# Patient Record
Sex: Male | Born: 1960 | Race: White | Hispanic: No | Marital: Married | Smoking: Former smoker
Health system: Southern US, Community
[De-identification: ages and names within clinical notes are randomized; demographics above are authoritative.]

## PROBLEM LIST (undated history)

## (undated) DIAGNOSIS — R51 Headache: Secondary | ICD-10-CM

## (undated) DIAGNOSIS — H269 Unspecified cataract: Secondary | ICD-10-CM

## (undated) DIAGNOSIS — R220 Localized swelling, mass and lump, head: Secondary | ICD-10-CM

## (undated) DIAGNOSIS — M1990 Unspecified osteoarthritis, unspecified site: Secondary | ICD-10-CM

## (undated) DIAGNOSIS — F419 Anxiety disorder, unspecified: Secondary | ICD-10-CM

## (undated) DIAGNOSIS — E785 Hyperlipidemia, unspecified: Secondary | ICD-10-CM

## (undated) DIAGNOSIS — C801 Malignant (primary) neoplasm, unspecified: Secondary | ICD-10-CM

## (undated) DIAGNOSIS — G43109 Migraine with aura, not intractable, without status migrainosus: Secondary | ICD-10-CM

## (undated) DIAGNOSIS — Z9289 Personal history of other medical treatment: Secondary | ICD-10-CM

## (undated) DIAGNOSIS — K219 Gastro-esophageal reflux disease without esophagitis: Secondary | ICD-10-CM

## (undated) DIAGNOSIS — I10 Essential (primary) hypertension: Secondary | ICD-10-CM

## (undated) HISTORY — DX: Gastro-esophageal reflux disease without esophagitis: K21.9

## (undated) HISTORY — DX: Hyperlipidemia, unspecified: E78.5

## (undated) HISTORY — DX: Headache: R51

## (undated) HISTORY — DX: Unspecified cataract: H26.9

## (undated) HISTORY — DX: Migraine with aura, not intractable, without status migrainosus: G43.109

## (undated) HISTORY — DX: Personal history of other medical treatment: Z92.89

## (undated) HISTORY — DX: Localized swelling, mass and lump, head: R22.0

## (undated) HISTORY — PX: TOE SURGERY: SHX1073

## (undated) HISTORY — PX: COLONOSCOPY: SHX174

## (undated) HISTORY — DX: Anxiety disorder, unspecified: F41.9

## (undated) HISTORY — DX: Malignant (primary) neoplasm, unspecified: C80.1

## (undated) HISTORY — PX: CARPAL TUNNEL RELEASE: SHX101

## (undated) HISTORY — DX: Essential (primary) hypertension: I10

---
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
## 1981-12-04 HISTORY — PX: SKIN CANCER EXCISION: SHX779

## 1997-12-04 DIAGNOSIS — Z9289 Personal history of other medical treatment: Secondary | ICD-10-CM

## 1997-12-04 HISTORY — DX: Personal history of other medical treatment: Z92.89

## 1997-12-04 HISTORY — PX: KNEE ARTHROSCOPY: SUR90

## 1998-10-18 ENCOUNTER — Other Ambulatory Visit: Admission: RE | Admit: 1998-10-18 | Discharge: 1998-10-18 | Payer: Self-pay | Admitting: Gastroenterology

## 1998-10-18 HISTORY — PX: ESOPHAGOGASTRODUODENOSCOPY: SHX1529

## 1998-11-03 HISTORY — PX: ESOPHAGOGASTRODUODENOSCOPY: SHX1529

## 2002-11-03 HISTORY — PX: KNEE ARTHROSCOPY: SUR90

## 2004-12-22 ENCOUNTER — Ambulatory Visit: Payer: Self-pay | Admitting: Family Medicine

## 2005-05-23 ENCOUNTER — Ambulatory Visit: Payer: Self-pay | Admitting: Family Medicine

## 2005-05-25 ENCOUNTER — Ambulatory Visit: Payer: Self-pay | Admitting: Family Medicine

## 2005-06-30 ENCOUNTER — Ambulatory Visit: Payer: Self-pay | Admitting: Family Medicine

## 2005-07-06 ENCOUNTER — Ambulatory Visit: Payer: Self-pay | Admitting: Family Medicine

## 2005-10-11 ENCOUNTER — Ambulatory Visit: Payer: Self-pay | Admitting: Family Medicine

## 2006-03-08 ENCOUNTER — Emergency Department: Payer: Self-pay | Admitting: Unknown Physician Specialty

## 2006-05-28 ENCOUNTER — Ambulatory Visit: Payer: Self-pay | Admitting: Family Medicine

## 2006-05-30 ENCOUNTER — Ambulatory Visit: Payer: Self-pay | Admitting: Family Medicine

## 2006-11-01 ENCOUNTER — Ambulatory Visit: Payer: Self-pay | Admitting: Family Medicine

## 2006-11-21 ENCOUNTER — Ambulatory Visit: Payer: Self-pay | Admitting: Family Medicine

## 2006-12-25 ENCOUNTER — Ambulatory Visit: Payer: Self-pay | Admitting: Family Medicine

## 2007-02-04 ENCOUNTER — Ambulatory Visit: Payer: Self-pay | Admitting: Family Medicine

## 2007-05-24 DIAGNOSIS — K219 Gastro-esophageal reflux disease without esophagitis: Secondary | ICD-10-CM | POA: Insufficient documentation

## 2007-05-24 DIAGNOSIS — I10 Essential (primary) hypertension: Secondary | ICD-10-CM | POA: Insufficient documentation

## 2007-05-24 DIAGNOSIS — M129 Arthropathy, unspecified: Secondary | ICD-10-CM | POA: Insufficient documentation

## 2007-06-03 ENCOUNTER — Ambulatory Visit: Payer: Self-pay | Admitting: Family Medicine

## 2007-06-03 LAB — CONVERTED CEMR LAB
ALT: 36 units/L (ref 0–53)
AST: 27 units/L (ref 0–37)
Basophils Relative: 0.8 % (ref 0.0–1.0)
Bilirubin, Direct: 0.1 mg/dL (ref 0.0–0.3)
Calcium: 9.6 mg/dL (ref 8.4–10.5)
Chloride: 108 meq/L (ref 96–112)
Creatinine, Ser: 1.4 mg/dL (ref 0.4–1.5)
Eosinophils Absolute: 0.2 10*3/uL (ref 0.0–0.6)
Eosinophils Relative: 4.9 % (ref 0.0–5.0)
GFR calc non Af Amer: 57 mL/min
Glucose, Bld: 98 mg/dL (ref 70–99)
HCT: 40.3 % (ref 39.0–52.0)
HDL: 30.2 mg/dL — ABNORMAL LOW (ref >39.0)
MCV: 88.9 fL (ref 78.0–100.0)
Neutrophils Relative %: 55.2 % (ref 43.0–77.0)
Platelets: 255 10*3/uL (ref 150–400)
RBC: 4.54 M/uL (ref 4.22–5.81)
RDW: 12.8 % (ref 11.5–14.6)
Total Bilirubin: 0.6 mg/dL (ref 0.3–1.2)
Total CHOL/HDL Ratio: 10.3
Triglycerides: 266 mg/dL (ref 0–149)
VLDL: 53 mg/dL — ABNORMAL HIGH (ref 0–40)
WBC: 5.1 10*3/uL (ref 4.5–10.5)

## 2007-09-11 ENCOUNTER — Ambulatory Visit: Payer: Self-pay | Admitting: Family Medicine

## 2007-10-21 ENCOUNTER — Ambulatory Visit: Payer: Self-pay | Admitting: Family Medicine

## 2007-10-21 LAB — CONVERTED CEMR LAB
ALT: 24 units/L (ref 0–53)
AST: 24 units/L (ref 0–37)

## 2007-12-16 ENCOUNTER — Ambulatory Visit: Payer: Self-pay | Admitting: Family Medicine

## 2007-12-16 LAB — CONVERTED CEMR LAB
ALT: 25 units/L (ref 0–53)
HDL: 27.3 mg/dL — ABNORMAL LOW (ref >39.0)
Total CHOL/HDL Ratio: 9.4
Triglycerides: 258 mg/dL (ref 0–149)

## 2007-12-18 ENCOUNTER — Ambulatory Visit: Payer: Self-pay | Admitting: Family Medicine

## 2008-01-29 ENCOUNTER — Ambulatory Visit: Payer: Self-pay | Admitting: Family Medicine

## 2008-01-29 LAB — CONVERTED CEMR LAB: AST: 24 units/L (ref 0–37)

## 2008-02-17 ENCOUNTER — Telehealth: Payer: Self-pay | Admitting: Family Medicine

## 2008-03-23 ENCOUNTER — Ambulatory Visit: Payer: Self-pay | Admitting: Family Medicine

## 2008-03-23 ENCOUNTER — Telehealth: Payer: Self-pay | Admitting: Family Medicine

## 2008-03-23 LAB — CONVERTED CEMR LAB
Cholesterol: 213 mg/dL (ref 0–200)
HDL: 32.4 mg/dL — ABNORMAL LOW (ref >39.0)
Total CHOL/HDL Ratio: 6.6
VLDL: 90 mg/dL — ABNORMAL HIGH (ref 0–40)

## 2008-03-26 ENCOUNTER — Ambulatory Visit: Payer: Self-pay | Admitting: Family Medicine

## 2008-05-08 ENCOUNTER — Ambulatory Visit: Payer: Self-pay | Admitting: Family Medicine

## 2008-05-11 LAB — CONVERTED CEMR LAB
ALT: 31 units/L (ref 0–53)
AST: 26 units/L (ref 0–37)
Albumin: 3.8 g/dL (ref 3.5–5.2)
Alkaline Phosphatase: 62 units/L (ref 39–117)
Total Protein: 6.5 g/dL (ref 6.0–8.3)

## 2008-05-13 ENCOUNTER — Encounter: Payer: Self-pay | Admitting: Family Medicine

## 2008-05-15 ENCOUNTER — Encounter: Payer: Self-pay | Admitting: Family Medicine

## 2008-05-29 ENCOUNTER — Telehealth: Payer: Self-pay | Admitting: Family Medicine

## 2008-09-25 ENCOUNTER — Encounter (INDEPENDENT_AMBULATORY_CARE_PROVIDER_SITE_OTHER): Payer: Self-pay | Admitting: *Deleted

## 2009-01-05 ENCOUNTER — Telehealth: Payer: Self-pay | Admitting: Family Medicine

## 2009-05-10 ENCOUNTER — Ambulatory Visit: Payer: Self-pay | Admitting: Family Medicine

## 2009-05-10 LAB — CONVERTED CEMR LAB
Bilirubin, Direct: 0.1 mg/dL (ref 0.0–0.3)
Calcium: 9.7 mg/dL (ref 8.4–10.5)
Creatinine, Ser: 1.2 mg/dL (ref 0.4–1.5)
Eosinophils Relative: 3 % (ref 0.0–5.0)
GFR calc non Af Amer: 68.03 mL/min (ref >60)
HDL: 32.6 mg/dL — ABNORMAL LOW (ref >39.00)
MCV: 88.4 fL (ref 78.0–100.0)
Monocytes Absolute: 0.5 10*3/uL (ref 0.1–1.0)
Neutrophils Relative %: 58.1 % (ref 43.0–77.0)
PSA: 0.69 ng/mL (ref 0.10–4.00)
Platelets: 188 10*3/uL (ref 150.0–400.0)
TSH: 0.79 microintl units/mL (ref 0.35–5.50)
Total Bilirubin: 0.8 mg/dL (ref 0.3–1.2)
Total CHOL/HDL Ratio: 5
Total Protein: 7.1 g/dL (ref 6.0–8.3)
Triglycerides: 228 mg/dL — ABNORMAL HIGH (ref 0.0–149.0)
VLDL: 45.6 mg/dL — ABNORMAL HIGH (ref 0.0–40.0)
WBC: 5.4 10*3/uL (ref 4.5–10.5)

## 2009-05-13 ENCOUNTER — Ambulatory Visit: Payer: Self-pay | Admitting: Family Medicine

## 2009-06-08 ENCOUNTER — Telehealth: Payer: Self-pay | Admitting: Family Medicine

## 2009-07-14 ENCOUNTER — Ambulatory Visit: Payer: Self-pay | Admitting: Family Medicine

## 2009-07-14 LAB — CONVERTED CEMR LAB: OCCULT 1: POSITIVE

## 2009-07-15 ENCOUNTER — Ambulatory Visit: Payer: Self-pay | Admitting: Family Medicine

## 2009-08-06 ENCOUNTER — Ambulatory Visit: Payer: Self-pay | Admitting: Gastroenterology

## 2009-08-20 ENCOUNTER — Ambulatory Visit: Payer: Self-pay | Admitting: Gastroenterology

## 2009-08-20 LAB — HM COLONOSCOPY

## 2010-07-11 ENCOUNTER — Encounter (INDEPENDENT_AMBULATORY_CARE_PROVIDER_SITE_OTHER): Payer: Self-pay | Admitting: *Deleted

## 2010-07-26 ENCOUNTER — Telehealth: Payer: Self-pay | Admitting: Family Medicine

## 2010-09-05 ENCOUNTER — Ambulatory Visit: Payer: Self-pay | Admitting: Family Medicine

## 2010-09-05 LAB — CONVERTED CEMR LAB
AST: 32 units/L (ref 0–37)
Albumin: 4.1 g/dL (ref 3.5–5.2)
Alkaline Phosphatase: 80 units/L (ref 39–117)
Basophils Relative: 0.5 % (ref 0.0–3.0)
CO2: 27 meq/L (ref 19–32)
Calcium: 9.2 mg/dL (ref 8.4–10.5)
Eosinophils Relative: 4.7 % (ref 0.0–5.0)
GFR calc non Af Amer: 69.68 mL/min (ref >60)
HDL: 36.2 mg/dL — ABNORMAL LOW (ref >39.00)
Hemoglobin: 14.2 g/dL (ref 13.0–17.0)
Lymphocytes Relative: 24 % (ref 12.0–46.0)
Monocytes Relative: 8.7 % (ref 3.0–12.0)
Neutro Abs: 3.5 10*3/uL (ref 1.4–7.7)
RBC: 4.5 M/uL (ref 4.22–5.81)
Sodium: 137 meq/L (ref 135–145)
Total CHOL/HDL Ratio: 7
Total Protein: 6.6 g/dL (ref 6.0–8.3)
Triglycerides: 393 mg/dL — ABNORMAL HIGH (ref 0.0–149.0)
WBC: 5.7 10*3/uL (ref 4.5–10.5)

## 2010-09-07 ENCOUNTER — Ambulatory Visit: Payer: Self-pay | Admitting: Family Medicine

## 2011-01-03 NOTE — Progress Notes (Signed)
Summary: hyoscyamine  Phone Note Refill Request Message from:  Fax from Pharmacy on July 26, 2010 11:37 AM  Refills Requested: Medication #1:  HYOSCYAMINE SULFATE 0.125 MG SUBL Take one by mouth before meals and at bedtime prn Refill request from [facility] pharmacy. [phone].   Initial call taken by: Melody Comas,  July 26, 2010 11:38 AM    Prescriptions: HYOSCYAMINE SULFATE 0.125 MG SUBL (HYOSCYAMINE SULFATE) Take one by mouth before meals and at bedtime prn  #120 x 12   Entered and Authorized by:   Crawford Givens MD   Signed by:   Crawford Givens MD on 07/26/2010   Method used:   Electronically to        [facility]* (retail)       [address]       [place], [place]  [address]       Ph: [phone]       Fax: [phone]   RxID:   [id number]

## 2011-01-03 NOTE — Assessment & Plan Note (Signed)
Summary: cpx/alc   Vital Signs:  Patient profile:   50 year old male Height:      71 inches Weight:      195 pounds BMI:     27.30 Temp:     98.2 degrees F oral Pulse rate:   64 / minute Pulse rhythm:   regular BP sitting:   124 / 84  (left arm) Cuff size:   large  Vitals Entered By: Sydell Axon LPN (September 07, 2010 2:54 PM) CC: 30 Minute checkup, had a colonoscopy 09/10 by Dr. Arlyce Dice, requested flu vaccine   History of Present Illness: Pt here for Comp Exam. Feels well with no complaints. He had insect bite on the right elbow which made him sick. He was given shot of antibiotic and had swelling for a while....finally now getting back to normal.  Preventive Screening-Counseling & Management  Alcohol-Tobacco     Alcohol drinks/day: 0     Smoking Status: quit     Year Quit: 1996     Pack years: 25     Passive Smoke Exposure: no  Caffeine-Diet-Exercise     Caffeine use/day: 5+     Does Patient Exercise: yes     Type of exercise: ex bike     Times/week: 3  Problems Prior to Update: 1)  Guaiac Positive Stool Via Cards 2/6  (ICD-578.1) 2)  Special Screening Malig Neoplasms Other Sites  (ICD-V76.49) 3)  Health Maintenance Exam  (ICD-V70.0) 4)  Screening For Malignant Neoplasm, Prostate  (ICD-V76.44) 5)  Hypercholesterolemia/trig 272/672  (ICD-272.0) 6)  Arthritis  (ICD-716.90) 7)  Hx, Personal, Tobacco Use, Quit 02/01  (ICD-V15.82) 8)  Hypertension  (ICD-401.9) 9)  Headaches, Frequent (DR. MANNIX)  (ICD-784.0) 10)  Gerd  (ICD-530.81) 11)  Anxiety, (DR. Dorethea Clan)  (ICD-300.00)  Medications Prior to Update: 1)  Bisoprolol-Hydrochlorothiazide 5-6.25 Mg Tabs (Bisoprolol-Hydrochlorothiazide) .... Take One By Mouth Daily 2)  Hyoscyamine Sulfate 0.125 Mg Subl (Hyoscyamine Sulfate) .... Take One By Mouth Before Meals and At Bedtime Prn 3)  Prilosec Otc 20 Mg Tbec (Omeprazole Magnesium) .... Take 1 Tablet By Mouth Once A Day 4)  Calan Sr 240 Mg  Tbcr (Verapamil Hcl) .... Take One  By Mouth Twice A Day 5)  Paxil 20 Mg  Tabs (Paroxetine Hcl) .... Take Two By Mouth Daily 6)  Crestor 10 Mg  Tabs (Rosuvastatin Calcium) .... One Tab By Mouth At Night  Allergies: No Known Drug Allergies  Past History:  Past Medical History: Last updated: 05/24/2007 Anxiety GERD Headache Hypertension  Past Surgical History: Last updated: 08/22/2009 Tonsillectomy 1968 Skin Ca Excision 1983 Arthroscopy left knee 1999 EGD- Esoph stricture, esophagitis, duodenitis 10/18/98 EGD- Esoph dilation 12/99 ETT- wnl 1999 Arthroscopy right knee (Dr. Chaney Malling 12/03 Neck Excision (Dr Bertram Savin) 05/15/2008 Colonoscopy Mild divertics o/w Nml ([name]) 08/20/09      10 yrs.  Family History: Last updated: 09/07/2010 Father: dec 74 COPD   and has alcoholism  Lung Ca Mother: A 30  HTN Chol  depression/dementia Sister  A 57  mitral valve prolapse breast Ca (Mastectomy  2007)  Social History: Last updated: 09/11/2007 Marital Status: Married Children: 1 Occupation: Equities trader in [employer]  Risk Factors: Alcohol Use: 0 (09/07/2010) Caffeine Use: 5+ (09/07/2010) Exercise: yes (09/07/2010)  Risk Factors: Smoking Status: quit (09/07/2010) Passive Smoke Exposure: no (09/07/2010)  Family History: Father: dec 74 COPD   and has alcoholism  Lung Ca Mother: A 74  HTN Chol  depression/dementia Sister  A 81  mitral valve prolapse breast Ca (Mastectomy  2007)  Review of Systems General:  Denies chills, fatigue, fever, sweats, weakness, and weight loss. Eyes:  Denies blurring, eye pain, and itching. ENT:  Denies decreased hearing, earache, and ringing in ears. CV:  Denies chest pain or discomfort, fainting, fatigue, palpitations, shortness of breath with exertion, swelling of feet, and swelling of hands. Resp:  Denies cough, shortness of breath, and wheezing. GI:  Denies abdominal pain, bloody stools, change in bowel habits, constipation, dark tarry stools,  diarrhea, indigestion, loss of appetite, nausea, vomiting, vomiting blood, and yellowish skin color. GU:  Complains of nocturia; denies discharge, dysuria, and urinary frequency; stable. MS:  Complains of joint pain; denies low back pain, muscle aches, and cramps. Derm:  Denies dryness, itching, and rash. Neuro:  Denies numbness, poor balance, tingling, and tremors.  Physical Exam  General:  Well-developed,well-nourished,in no acute distress; alert,appropriate and cooperative throughout examination Head:  Normocephalic and atraumatic without obvious abnormalities. No apparent alopecia or balding. Eyes:  Conjunctiva clear bilaterally.  Ears:  External ear exam shows no significant lesions or deformities.  Otoscopic examination reveals clear canals, tympanic membranes are intact bilaterally without bulging, retraction, inflammation or discharge. Hearing is grossly normal bilaterally. Nose:  External nasal examination shows no deformity or inflammation. Nasal mucosa are pink and moist without lesions or exudates. Mouth:  Oral mucosa and oropharynx without lesions or exudates.  Teeth in good repair. Neck:  No deformities, masses, or tenderness noted. Chest Wall:  No deformities, masses, tenderness or gynecomastia noted. Breasts:  No masses or gynecomastia noted Lungs:  Normal respiratory effort, chest expands symmetrically. Lungs are clear to auscultation, no crackles or wheezes. Heart:  Normal rate and regular rhythm. S1 and S2 normal without gallop, murmur, click, rub or other extra sounds. Abdomen:  Bowel sounds positive,abdomen soft and non-tender without masses, organomegaly or hernias noted. Rectal:  No external abnormalities noted. Normal sphincter tone. No rectal masses or tenderness. G neg. Genitalia:  Testes bilaterally descended without nodularity, tenderness or masses. No scrotal masses or lesions. No penis lesions or urethral discharge. Prostate:  Prostate gland firm and smooth, no  enlargement, nodularity, tenderness, mass, asymmetry or induration. 20gm Msk:  No deformity or scoliosis noted of thoracic or lumbar spine.   Pulses:  R and L carotid,radial,femoral,dorsalis pedis and posterior tibial pulses are full and equal bilaterally Extremities:  No clubbing, cyanosis, edema, or deformity noted with normal full range of motion of all joints.   Neurologic:  No cranial nerve deficits noted. Station and gait are normal.  Sensory, motor and coordinative functions appear intact. Skin:  Intact without suspicious lesions or rashes, lipoma 3cm top of posterior neck just into scalp. Cervical Nodes:  No lymphadenopathy noted Inguinal Nodes:  No significant adenopathy Psych:  Cognition and judgment appear intact. Alert and cooperative with normal attention span and concentration. No apparent delusions, illusions, hallucinations   Impression & Recommendations:  Problem # 1:  HEALTH MAINTENANCE EXAM (ICD-V70.0)  Problem # 2:  GUAIAC POSITIVE STOOL VIA CARDS 2/6 (ICD-578.1) Colonoscopy in Oct nml.  Problem # 3:  SCREENING FOR MALIGNANT NEOPLASM, PROSTATE (ICD-V76.44) Assessment: Unchanged Stable.  Problem # 4:  HYPERCHOLESTEROLEMIA/TRIG 272/672 (ICD-272.0) Assessment: Deteriorated  Off Crestor for 2 months due to being told at [facility] that liver and kidney fctn off. Nos today fine...back on Crestor. His updated medication list for this problem includes:    Crestor 10 Mg Tabs (Rosuvastatin calcium) ..... One tab by mouth at night  Labs Reviewed: SGOT:  32 (09/05/2010)   SGPT: 42 (09/05/2010)   HDL:36.20 (09/05/2010), 32.60 (05/10/2009)  LDL:DEL (03/23/2008), DEL (12/16/2007)  Chol:247 (09/05/2010), 167 (05/10/2009)  Trig:393.0 (09/05/2010), 228.0 (05/10/2009)  Problem # 5:  HYPERTENSION (ICD-401.9) Assessment: Unchanged Great control, cont curr meds. His updated medication list for this problem includes:    Bisoprolol-hydrochlorothiazide 5-6.25 Mg Tabs  (Bisoprolol-hydrochlorothiazide) .Marland Kitchen... Take one by mouth daily    Calan Sr 240 Mg Tbcr (Verapamil hcl) .Marland Kitchen... Take one by mouth twice a day  BP today: 124/84 Prior BP: 120/80 (07/15/2009)  Labs Reviewed: K+: 3.9 (09/05/2010) Creat: : 1.2 (09/05/2010)   Chol: 247 (09/05/2010)   HDL: 36.20 (09/05/2010)   LDL: DEL (03/23/2008)   TG: 393.0 (09/05/2010)  Problem # 6:  HEADACHES, FREQUENT (DR. MANNIX) (ICD-784.0) Assessment: Unchanged Well controlled. His updated medication list for this problem includes:    Bisoprolol-hydrochlorothiazide 5-6.25 Mg Tabs (Bisoprolol-hydrochlorothiazide) .Marland Kitchen... Take one by mouth daily  Problem # 7:  ANXIETY, (DR. Dorethea Clan) (ICD-300.00) Assessment: Unchanged  Well controlled, cont curr meds. His updated medication list for this problem includes:    Paxil 20 Mg Tabs (Paroxetine hcl) .Marland Kitchen... Take two by mouth daily  Discussed medication use and relaxation techniques.   Problem # 8:  GERD (ICD-530.81) Assessment: Unchanged Stable. His updated medication list for this problem includes:    Hyoscyamine Sulfate 0.125 Mg Subl (Hyoscyamine sulfate) .Marland Kitchen... Take one by mouth before meals and at bedtime prn    Prilosec Otc 20 Mg Tbec (Omeprazole magnesium) .Marland Kitchen... Take 1 tablet by mouth once a day  Diagnostics Reviewed:  Discussed lifestyle modifications, diet, antacids/medications, and preventive measures. Handout provided.   Complete Medication List: 1)  Bisoprolol-hydrochlorothiazide 5-6.25 Mg Tabs (Bisoprolol-hydrochlorothiazide) .... Take one by mouth daily 2)  Hyoscyamine Sulfate 0.125 Mg Subl (Hyoscyamine sulfate) .... Take one by mouth before meals and at bedtime prn 3)  Prilosec Otc 20 Mg Tbec (Omeprazole magnesium) .... Take 1 tablet by mouth once a day 4)  Calan Sr 240 Mg Tbcr (Verapamil hcl) .... Take one by mouth twice a day 5)  Paxil 20 Mg Tabs (Paroxetine hcl) .... Take two by mouth daily 6)  Crestor 10 Mg Tabs (Rosuvastatin calcium) .... One tab by mouth  at night  Other Orders: Admin 1st Vaccine ([address]) Flu Vaccine 76yrs + ([address])  Patient Instructions: 1)  RTC one year, sooner as needed.  2)  Back on Crestor, watch sweets and carb intake.  Current Allergies (reviewed today): No known allergies   Flu Vaccine Consent Questions     Do you have a history of severe allergic reactions to this vaccine? no    Any prior history of allergic reactions to egg and/or gelatin? no    Do you have a sensitivity to the preservative Thimersol? no    Do you have a past history of Guillan-Barre Syndrome? no    Do you currently have an acute febrile illness? no    Have you ever had a severe reaction to latex? no    Vaccine information given and explained to patient? yes    Are you currently pregnant? no    Lot Number:[id number]   Exp Date:06/03/2011   Site Given  Left Deltoid IMlu

## 2011-01-03 NOTE — Letter (Signed)
Summary: Nadara Eaton letter  [place] [facility]  [address] [place], [place] [address]   Phone: [phone]  Fax: [phone]       07/11/2010 MRN: [id number]  HARLIN MAZZONI [address] [place], [place]  [address]  Dear Mr. HLAVAC,  [facility], and [facility] announce the retirement of Arta Silence, M.D., from full-time practice at the [facility] office effective June 02, 2010 and his plans of returning part-time.  It is important to Dr. Hetty Ely and to our practice that you understand that [facility] has seven physicians in our office for your health care needs.  We will continue to offer the same exceptional care that you have today.    Dr. Hetty Ely has spoken to many of you about his plans for retirement and returning part-time in the fall.   We will continue to work with you through the transition to schedule appointments for you in the office and meet the high standards that [place] is committed to.   Again, it is with great pleasure that we share the news that Dr. Hetty Ely will return to [facility] in October of 2011 with a reduced schedule.    If you have any questions, or would like to request an appointment with one of our physicians, please call us at [phone] and press the option for Scheduling an appointment.  We take pleasure in providing you with excellent patient care and look forward to seeing you at your next office visit.  Our [facility] Physicians are:  Tillman Abide, M.D. Laurita Quint, M.D. Roxy Manns, M.D. Kerby Nora, M.D. Hannah Beat, M.D. Ruthe Mannan, M.D. We proudly welcomed Raechel Ache, M.D. and Eustaquio Boyden, M.D. to the practice in July/August 2011.  Sincerely,   [facility]

## 2011-09-27 ENCOUNTER — Other Ambulatory Visit: Payer: Self-pay | Admitting: Family Medicine

## 2011-09-27 DIAGNOSIS — Z0000 Encounter for general adult medical examination without abnormal findings: Secondary | ICD-10-CM

## 2011-09-27 DIAGNOSIS — Z125 Encounter for screening for malignant neoplasm of prostate: Secondary | ICD-10-CM

## 2011-09-27 DIAGNOSIS — M129 Arthropathy, unspecified: Secondary | ICD-10-CM

## 2011-09-27 DIAGNOSIS — I10 Essential (primary) hypertension: Secondary | ICD-10-CM

## 2011-09-29 ENCOUNTER — Other Ambulatory Visit (INDEPENDENT_AMBULATORY_CARE_PROVIDER_SITE_OTHER): Payer: BC Managed Care – PPO

## 2011-09-29 DIAGNOSIS — Z0000 Encounter for general adult medical examination without abnormal findings: Secondary | ICD-10-CM

## 2011-09-29 DIAGNOSIS — Z125 Encounter for screening for malignant neoplasm of prostate: Secondary | ICD-10-CM

## 2011-09-29 DIAGNOSIS — I10 Essential (primary) hypertension: Secondary | ICD-10-CM

## 2011-09-29 DIAGNOSIS — M129 Arthropathy, unspecified: Secondary | ICD-10-CM

## 2011-09-29 LAB — CBC WITH DIFFERENTIAL/PLATELET
Eosinophils Absolute: 0.2 10*3/uL (ref 0.0–0.7)
Eosinophils Relative: 3 % (ref 0.0–5.0)
Lymphocytes Relative: 28.4 % (ref 12.0–46.0)
MCV: 91.3 fl (ref 78.0–100.0)
Monocytes Absolute: 0.5 10*3/uL (ref 0.1–1.0)
Neutrophils Relative %: 60.4 % (ref 43.0–77.0)
Platelets: 210 10*3/uL (ref 150.0–400.0)
RBC: 4.71 Mil/uL (ref 4.22–5.81)
WBC: 6.3 10*3/uL (ref 4.5–10.5)

## 2011-09-29 LAB — BASIC METABOLIC PANEL
Chloride: 103 mEq/L (ref 96–112)
GFR: 67.39 mL/min (ref >60.00)
Glucose, Bld: 92 mg/dL (ref 70–99)
Potassium: 4.4 mEq/L (ref 3.5–5.1)
Sodium: 141 mEq/L (ref 135–145)

## 2011-09-29 LAB — LIPID PANEL
HDL: 35.7 mg/dL — ABNORMAL LOW (ref >39.00)
VLDL: 156 mg/dL — ABNORMAL HIGH (ref 0.0–40.0)

## 2011-09-29 LAB — HEPATIC FUNCTION PANEL
AST: 20 U/L (ref 0–37)
Alkaline Phosphatase: 65 U/L (ref 39–117)
Bilirubin, Direct: 0.1 mg/dL (ref 0.0–0.3)

## 2011-09-29 LAB — TSH: TSH: 1 u[IU]/mL (ref 0.35–5.50)

## 2011-09-29 LAB — PSA: PSA: 0.84 ng/mL (ref 0.10–4.00)

## 2011-09-29 LAB — LDL CHOLESTEROL, DIRECT: Direct LDL: 102.9 mg/dL

## 2011-10-03 ENCOUNTER — Encounter: Payer: Self-pay | Admitting: Family Medicine

## 2011-10-05 ENCOUNTER — Ambulatory Visit (INDEPENDENT_AMBULATORY_CARE_PROVIDER_SITE_OTHER): Payer: BC Managed Care – PPO | Admitting: Family Medicine

## 2011-10-05 ENCOUNTER — Encounter: Payer: Self-pay | Admitting: Family Medicine

## 2011-10-05 VITALS — BP 138/80 | HR 60 | Temp 98.1°F | Ht 71.0 in | Wt 204.5 lb

## 2011-10-05 DIAGNOSIS — K219 Gastro-esophageal reflux disease without esophagitis: Secondary | ICD-10-CM

## 2011-10-05 DIAGNOSIS — R5381 Other malaise: Secondary | ICD-10-CM

## 2011-10-05 DIAGNOSIS — I10 Essential (primary) hypertension: Secondary | ICD-10-CM

## 2011-10-05 DIAGNOSIS — E7800 Pure hypercholesterolemia, unspecified: Secondary | ICD-10-CM | POA: Insufficient documentation

## 2011-10-05 DIAGNOSIS — R5383 Other fatigue: Secondary | ICD-10-CM | POA: Insufficient documentation

## 2011-10-05 DIAGNOSIS — M129 Arthropathy, unspecified: Secondary | ICD-10-CM

## 2011-10-05 DIAGNOSIS — Z23 Encounter for immunization: Secondary | ICD-10-CM

## 2011-10-05 NOTE — Assessment & Plan Note (Signed)
Stable. Cont Prilosec.

## 2011-10-05 NOTE — Progress Notes (Signed)
  Subjective:    Patient ID: Martin Cole, male    DOB: 1961/09/24, 50 y.o.   MRN: [id number]  HPI Pt here for Comp Exam. He has noticed fatigue lately but admits lifestyle has changed.   He would like to change from Crestor to something else due to money. He has pain in his right great toe where he fractured it. Has swelling in the area that appears to be bony hypertrophy I feel is reaction to old fracture.  Review of Systems  Constitutional: Positive for fatigue (see HPI.). Negative for fever, chills, diaphoresis, appetite change and unexpected weight change.  HENT: Negative for hearing loss, ear pain, tinnitus and ear discharge.   Eyes: Positive for visual disturbance (natural.). Negative for pain and discharge.  Respiratory: Negative for cough, shortness of breath and wheezing.   Cardiovascular: Negative for chest pain and palpitations.       No SOB w/ exertion  Gastrointestinal: Negative for nausea, vomiting, abdominal pain, diarrhea, constipation and blood in stool.       No heartburn or swallowing problems.  Genitourinary: Negative for dysuria, frequency and difficulty urinating.       No nocturia  Musculoskeletal: Positive for arthralgias (see HPI). Negative for myalgias and back pain.  Skin: Negative for rash.       No itching or dryness.  Neurological: Negative for tremors and numbness.       No tingling or balance problems.  Hematological: Negative for adenopathy. Does not bruise/bleed easily.  Psychiatric/Behavioral: Negative for dysphoric mood and agitation.      Objective:   Physical Exam  Constitutional: He is oriented to person, place, and time. He appears well-developed and well-nourished. No distress.  HENT:  Head: Normocephalic and atraumatic.  Right Ear: External ear normal.  Left Ear: External ear normal.  Nose: Nose normal.  Mouth/Throat: Oropharynx is clear and moist.  Eyes: Conjunctivae and EOM are normal. Pupils are equal, round, and reactive to light.  Right eye exhibits no discharge. Left eye exhibits no discharge. No scleral icterus.  Neck: Normal range of motion. Neck supple. No thyromegaly present.  Cardiovascular: Normal rate, regular rhythm, normal heart sounds and intact distal pulses.   No murmur heard. Pulmonary/Chest: Effort normal and breath sounds normal. No respiratory distress. He has no wheezes.  Abdominal: Soft. Bowel sounds are normal. He exhibits no distension and no mass. There is no tenderness. There is no rebound and no guarding.  Genitourinary: Penis normal.       No hernias felt. Rectal not done.  Musculoskeletal: Normal range of motion. He exhibits no edema.  Lymphadenopathy:    He has no cervical adenopathy.  Neurological: He is alert and oriented to person, place, and time. Coordination normal.  Skin: Skin is warm and dry. No rash noted. He is not diaphoretic.  Psychiatric: He has a normal mood and affect. His behavior is normal. Judgment and thought content normal.          Assessment & Plan:  HMPE

## 2011-10-05 NOTE — Assessment & Plan Note (Signed)
Feel this to be combination of lack of exercise, poor eating, weight gain and unhealthy lifestyle. We discussed all of these at length. He is motivated to try to do something about this. Will give until next year to see what he can do to improve nos. and weight.

## 2011-10-05 NOTE — Assessment & Plan Note (Signed)
Good control. Cont curr meds. Discussed healthy lifestyle. BP Readings from Last 3 Encounters:  10/05/11 138/80  09/07/10 124/84  07/15/09 120/80

## 2011-10-05 NOTE — Patient Instructions (Signed)
RTC one year for Comp Exam, sooner as needed. Avoid sweets and carbs carefully and get regular exercise to get Trigs down. Take Glucosamine and Chonroitin with MSM and Hyaluronic Acid for your knees.

## 2011-10-05 NOTE — Assessment & Plan Note (Signed)
Currently on Crestor with significantly high Trigs which he has been able to improve in the past. Discussed diet and exercise again with goal of losing weight. He did not want to switch to Lipitor and add Trig med. Will follow. Lab Results  Component Value Date   CHOL 276* 09/29/2011   HDL 35.70* 09/29/2011   LDLDIRECT 102.9 09/29/2011   TRIG 780.0* 09/29/2011   CHOLHDL 8 09/29/2011

## 2011-10-05 NOTE — Assessment & Plan Note (Signed)
Knees are better. Feel the foot reactive osteitis from site of former healing fracture. I do not recommend any trmt.

## 2011-11-16 ENCOUNTER — Other Ambulatory Visit: Payer: Self-pay | Admitting: *Deleted

## 2011-11-16 MED ORDER — BISOPROLOL-HYDROCHLOROTHIAZIDE 5-6.25 MG PO TABS: 1.0000 | ORAL_TABLET | Freq: Every day | ORAL | Status: DC

## 2011-11-16 NOTE — Telephone Encounter (Signed)
Received faxed refill request from pharmacy. Refill sent to pharmacy electronically. 

## 2011-12-19 ENCOUNTER — Other Ambulatory Visit: Payer: Self-pay | Admitting: *Deleted

## 2011-12-19 DIAGNOSIS — F411 Generalized anxiety disorder: Secondary | ICD-10-CM

## 2011-12-20 DIAGNOSIS — F411 Generalized anxiety disorder: Secondary | ICD-10-CM | POA: Insufficient documentation

## 2011-12-20 MED ORDER — PAROXETINE HCL 20 MG PO TABS: 40.0000 mg | ORAL_TABLET | ORAL | Status: DC

## 2011-12-20 NOTE — Telephone Encounter (Signed)
Left message on machine to call back  

## 2011-12-20 NOTE — Telephone Encounter (Signed)
Please verify dose with patient.  He was prev (according to old notes) taking total of 40mg  a day of paxil, but the new rx request was for 20mg  a day.  I changed it to 40mg  a day. If this is correct, then call in.  If not (and patient really was on 20mg  a day), then correct the dose/rx and send in as patient has been taking.  Thanks.

## 2011-12-21 MED ORDER — PAROXETINE HCL 20 MG PO TABS: 20.0000 mg | ORAL_TABLET | ORAL | Status: DC

## 2011-12-21 NOTE — Telephone Encounter (Signed)
Addended by: Annamarie Major on: 12/21/2011 12:02 PM   Modules accepted: Orders

## 2011-12-21 NOTE — Telephone Encounter (Signed)
Patient states he is taking 20 mg a day and it seems to be working well.  Changed on meds list and refill sent to pharmacy as directed.

## 2012-03-05 ENCOUNTER — Other Ambulatory Visit: Payer: Self-pay

## 2012-03-05 MED ORDER — ROSUVASTATIN CALCIUM 10 MG PO TABS: 10.0000 mg | ORAL_TABLET | Freq: Every day | ORAL | Status: DC

## 2012-03-05 NOTE — Telephone Encounter (Signed)
[facility] [facility] faxed request Crestor 10 mg #30 x 11.

## 2012-09-16 ENCOUNTER — Telehealth: Payer: Self-pay

## 2012-09-16 NOTE — Telephone Encounter (Signed)
Pt needs copy of last labs for wellness form for insurance. Pt has deadline prior to CPX scheduled. 11/01/12. Copy of 09/29/11 labs given to pt.

## 2012-10-11 ENCOUNTER — Other Ambulatory Visit: Payer: Self-pay

## 2012-10-11 MED ORDER — BISOPROLOL-HYDROCHLOROTHIAZIDE 5-6.25 MG PO TABS: 1.0000 | ORAL_TABLET | Freq: Every day | ORAL | Status: DC

## 2012-10-11 MED ORDER — ROSUVASTATIN CALCIUM 10 MG PO TABS: 10.0000 mg | ORAL_TABLET | Freq: Every day | ORAL | Status: DC

## 2012-10-11 NOTE — Telephone Encounter (Signed)
pts insurance requires 90 day rx now. Pt request 90 day for Ziac and Crestor to [facility] [address].pt has CPX scheduled 11/01/12. Pt notified done.

## 2012-10-19 ENCOUNTER — Other Ambulatory Visit: Payer: Self-pay | Admitting: Family Medicine

## 2012-10-19 DIAGNOSIS — Z125 Encounter for screening for malignant neoplasm of prostate: Secondary | ICD-10-CM

## 2012-10-19 DIAGNOSIS — I10 Essential (primary) hypertension: Secondary | ICD-10-CM

## 2012-10-28 ENCOUNTER — Other Ambulatory Visit (INDEPENDENT_AMBULATORY_CARE_PROVIDER_SITE_OTHER): Payer: BC Managed Care – PPO

## 2012-10-28 DIAGNOSIS — I10 Essential (primary) hypertension: Secondary | ICD-10-CM

## 2012-10-28 DIAGNOSIS — Z125 Encounter for screening for malignant neoplasm of prostate: Secondary | ICD-10-CM

## 2012-10-28 LAB — COMPREHENSIVE METABOLIC PANEL
AST: 20 U/L (ref 0–37)
Alkaline Phosphatase: 63 U/L (ref 39–117)
BUN: 17 mg/dL (ref 6–23)
Creatinine, Ser: 1 mg/dL (ref 0.4–1.5)
Potassium: 4.4 mEq/L (ref 3.5–5.1)
Total Bilirubin: 0.7 mg/dL (ref 0.3–1.2)

## 2012-10-28 LAB — LIPID PANEL
Cholesterol: 158 mg/dL (ref 0–200)
HDL: 30.4 mg/dL — ABNORMAL LOW (ref >39.00)
VLDL: 54.8 mg/dL — ABNORMAL HIGH (ref 0.0–40.0)

## 2012-10-28 LAB — LDL CHOLESTEROL, DIRECT: Direct LDL: 91.6 mg/dL

## 2012-10-31 ENCOUNTER — Telehealth: Payer: Self-pay | Admitting: Family Medicine

## 2012-10-31 NOTE — Telephone Encounter (Signed)
Called pt, LMOVM.  I am currently sick and will need to cancel clinic tomorrow.  I called pt to reschedule.

## 2012-10-31 NOTE — Telephone Encounter (Signed)
Opened in error

## 2012-11-01 ENCOUNTER — Encounter: Payer: BC Managed Care – PPO | Admitting: Family Medicine

## 2012-11-05 ENCOUNTER — Ambulatory Visit: Payer: BC Managed Care – PPO | Admitting: Family Medicine

## 2012-11-06 ENCOUNTER — Encounter: Payer: Self-pay | Admitting: Family Medicine

## 2012-11-06 ENCOUNTER — Ambulatory Visit (INDEPENDENT_AMBULATORY_CARE_PROVIDER_SITE_OTHER): Payer: BC Managed Care – PPO | Admitting: Family Medicine

## 2012-11-06 VITALS — BP 120/72 | HR 76 | Temp 97.9°F | Ht 71.0 in | Wt 194.8 lb

## 2012-11-06 DIAGNOSIS — E7800 Pure hypercholesterolemia, unspecified: Secondary | ICD-10-CM

## 2012-11-06 DIAGNOSIS — R739 Hyperglycemia, unspecified: Secondary | ICD-10-CM

## 2012-11-06 DIAGNOSIS — K219 Gastro-esophageal reflux disease without esophagitis: Secondary | ICD-10-CM

## 2012-11-06 DIAGNOSIS — Z23 Encounter for immunization: Secondary | ICD-10-CM

## 2012-11-06 DIAGNOSIS — F411 Generalized anxiety disorder: Secondary | ICD-10-CM

## 2012-11-06 DIAGNOSIS — R7309 Other abnormal glucose: Secondary | ICD-10-CM

## 2012-11-06 DIAGNOSIS — I10 Essential (primary) hypertension: Secondary | ICD-10-CM

## 2012-11-06 DIAGNOSIS — Z0000 Encounter for general adult medical examination without abnormal findings: Secondary | ICD-10-CM | POA: Insufficient documentation

## 2012-11-06 NOTE — Assessment & Plan Note (Addendum)
Routine anticipatory guidance given to patient.  See health maintenance. Tetanus 2006 Flu shot today PSA wnl.  Colonoscopy 2010 Living will d/w pt.  Started talking to his wife about it.  Wife is designated if incapacitated.   Labs d/w pt.  D/w pt about tobacco cessation.

## 2012-11-06 NOTE — Assessment & Plan Note (Signed)
Controlled, continue current meds.  Needs to work on diet, weight, exercise.  Discussed.

## 2012-11-06 NOTE — Patient Instructions (Addendum)
3 goals for the near future:  Diet, exercise, tobacco.  Work on one at a time.   Diet- Look at the diet section on the American Heart Association website.  I would look at low carb diets.   Exericise- several days a week. at a time.  Tobacco- wean off the chew.  The gum may help.   Take care.  Notify the pharmacy when you need refills.

## 2012-11-06 NOTE — Assessment & Plan Note (Signed)
Controlled with current meds.  Continue, given hx of esophageal dilation prev.

## 2012-11-06 NOTE — Assessment & Plan Note (Signed)
With elevated TG. Continue current meds.  Needs to work on diet, weight, exercise.  Discussed.  He agrees.

## 2012-11-06 NOTE — Progress Notes (Signed)
CPE- See plan.  Routine anticipatory guidance given to patient.  See health maintenance. Tetanus 2006 Flu shot today PSA wnl.  Colonoscopy 2010 Living will d/w pt.  Started talking to his wife about it.  Wife is designated if incapacitated.   Labs d/w pt.   Hypertension:    Using medication without problems or lightheadedness: yes Chest pain with exertion:no Edema:no Short of breath:no  GERD sx controlled with current meds.  H/o EGD with stricture dilation.  No dysphagia now.    Elevated Cholesterol: Using medications without problems:yes Muscle aches: mild, better than with lipitor Diet compliance: discussed, "my wife is from the [place]."  Exercise: discussed, encouraged more exercise.  "not as much as I used to."   Migraine and anxiety both improved on paxil.  Doing well.   Hyperglycemia on labs discussed.    PMH and SH reviewed  Meds, vitals, and allergies reviewed.   ROS: See HPI.  Otherwise negative.    GEN: nad, alert and oriented HEENT: mucous membranes moist NECK: supple w/o LA CV: rrr. PULM: ctab, no inc wob ABD: soft, +bs EXT: no edema SKIN: no acute rash

## 2012-11-06 NOTE — Assessment & Plan Note (Signed)
Improved with SSRI, continue as is.  Doing well.  Affect wnl.

## 2012-11-06 NOTE — Assessment & Plan Note (Signed)
Needs to work on diet, weight, exercise.  Discussed.

## 2013-01-06 ENCOUNTER — Encounter: Payer: BC Managed Care – PPO | Admitting: Family Medicine

## 2013-01-27 ENCOUNTER — Other Ambulatory Visit: Payer: Self-pay

## 2013-01-27 MED ORDER — ROSUVASTATIN CALCIUM 10 MG PO TABS: 10.0000 mg | ORAL_TABLET | Freq: Every day | ORAL | Status: DC

## 2013-01-27 MED ORDER — BISOPROLOL-HYDROCHLOROTHIAZIDE 5-6.25 MG PO TABS: 1.0000 | ORAL_TABLET | Freq: Every day | ORAL | Status: DC

## 2013-01-27 MED ORDER — VERAPAMIL HCL ER 240 MG PO TBCR: 240.0000 mg | EXTENDED_RELEASE_TABLET | Freq: Every day | ORAL | Status: DC

## 2013-01-27 MED ORDER — PAROXETINE HCL 20 MG PO TABS: 20.0000 mg | ORAL_TABLET | ORAL | Status: DC

## 2013-01-27 NOTE — Telephone Encounter (Signed)
Pt request refill Ziac,Crestor,Paxil and verapamil to [facility] [address]. Refilled Ziac and Crestor; please advise about refill Paxil for anxiety and verapamil(can't see where filled here before and meds & orders noted needs new order)Please advise.

## 2013-01-27 NOTE — Telephone Encounter (Signed)
Patient states it is 240 mg.  Rx sent to pharmacy.

## 2013-01-27 NOTE — Telephone Encounter (Addendum)
paxil sent.  Please verify the dose and frequency on the verapamil, send in 1 month supply with 12 rf.  Thanks.

## 2013-09-15 ENCOUNTER — Institutional Professional Consult (permissible substitution) (INDEPENDENT_AMBULATORY_CARE_PROVIDER_SITE_OTHER): Payer: BC Managed Care – PPO | Admitting: Podiatry

## 2013-09-15 ENCOUNTER — Encounter: Payer: Self-pay | Admitting: Podiatry

## 2013-09-15 VITALS — BP 145/95 | HR 66 | Resp 16 | Ht 72.0 in | Wt 194.0 lb

## 2013-09-15 DIAGNOSIS — M2021 Hallux rigidus, right foot: Secondary | ICD-10-CM

## 2013-09-15 DIAGNOSIS — M722 Plantar fascial fibromatosis: Secondary | ICD-10-CM

## 2013-09-15 DIAGNOSIS — M2020 Hallux rigidus, unspecified foot: Secondary | ICD-10-CM

## 2013-09-15 NOTE — Patient Instructions (Signed)

## 2013-09-15 NOTE — H&P (Signed)
Martin Cole is an 52 y.o. male.   Chief Complaint painful heel left HPI Chronic intractable plantar fasciitis left. Painful limitation first metatarsophalangeal joint right.  Past Medical History  Diagnosis Date  . Anxiety   . GERD (gastroesophageal reflux disease)   . Headache(784.0)   . Hypertension   . History of ETT 1999    wnl  . Hyperlipidemia     Past Surgical History  Procedure Laterality Date  . Tonsillectomy  1968  . Skin cancer excision  1983  . Knee arthroscopy  1999    left knee  . Knee arthroscopy  12/03    right  (Dr. Chaney Malling)  . Esophagogastroduodenoscopy  10/18/98    esoph stricture, esophagitis, duodenitis  . Esophagogastroduodenoscopy  12/99    esoph dilation    Family History  Problem Relation Age of Onset  . Hypertension Mother   . Hyperlipidemia Mother   . Depression Mother   . Dementia Mother   . COPD Father   . Alcohol abuse Father   . Cancer Father     lung  . Cancer Sister     breast, mastectomy 2007  . Heart disease Sister     mitral valve prolapse  . Prostate cancer Maternal Uncle   . Colon cancer Neg Hx    Social History:  reports that he quit smoking about 26 years ago. His smokeless tobacco use includes Chew. He reports that he does not drink alcohol or use illicit drugs.  Allergies:  Allergies  Allergen Reactions  . Lipitor [Atorvastatin]     Aches, but tolerated crestor     (Not in a hospital admission)  No results found for this or any previous visit (from the past 48 hour(s)). No results found.  Review of Systems  Musculoskeletal: Positive for joint pain.  All other systems reviewed and are negative.    Blood pressure 145/95, pulse 66, resp. rate 16, height 6' (1.829 m), weight 194 lb (87.998 kg). Physical Exam  Vital signs are stable he is alert and oriented x3. Pulses are palpable bilateral lower extremities. No calf pain. He has pain on palpation to the medial calcaneal tubercle of the left heel. No pain  or medial lateral compression of the calcaneus. He also has pain on palpation and range of motion of the first metatarsophalangeal joint right. This joint does demonstrate a very large nonpulsatile mass of bone was dorsal and medial spurring that limits the range of motion. Assessment/Plan Chronic intractable plantar fasciitis left heel. Hallux rigidus first metatarsophalangeal joint right.  Plan: We discussed the etiology and pathology conservative versus surgical therapies today. At this point we injected his left heel with 20 mg of Kenalog. He will continue conservative therapies with his left heel. We consented him for today for surgical reconstruction of the first metatarsophalangeal joint right foot. This will consist of a Keller arthroplasty with a single silicone implant first metatarsophalangeal joint right. I answered all the questions regarding his procedures to the best of my ability in layman's terms he understood he was amenable to it and signed all 3 pages of the consent form. We did discuss the possible postop complications which may include but are not limited to postop pain bleeding swelling and infection. He was dispensed a cam boot and will followup with him in a few weeks.  Bexley Mclester T 09/15/2013, 10:54 AM

## 2013-09-18 ENCOUNTER — Ambulatory Visit (INDEPENDENT_AMBULATORY_CARE_PROVIDER_SITE_OTHER): Payer: BC Managed Care – PPO

## 2013-09-18 DIAGNOSIS — Z23 Encounter for immunization: Secondary | ICD-10-CM

## 2013-10-13 ENCOUNTER — Other Ambulatory Visit: Payer: Self-pay | Admitting: Family Medicine

## 2013-11-04 ENCOUNTER — Other Ambulatory Visit: Payer: Self-pay | Admitting: Family Medicine

## 2013-12-12 ENCOUNTER — Encounter: Payer: Self-pay | Admitting: Podiatry

## 2013-12-12 DIAGNOSIS — M2030 Hallux varus (acquired), unspecified foot: Secondary | ICD-10-CM

## 2013-12-12 DIAGNOSIS — M722 Plantar fascial fibromatosis: Secondary | ICD-10-CM

## 2013-12-17 ENCOUNTER — Ambulatory Visit (INDEPENDENT_AMBULATORY_CARE_PROVIDER_SITE_OTHER): Payer: BC Managed Care – PPO | Admitting: Podiatry

## 2013-12-17 ENCOUNTER — Encounter: Payer: Self-pay | Admitting: Podiatry

## 2013-12-17 ENCOUNTER — Ambulatory Visit (INDEPENDENT_AMBULATORY_CARE_PROVIDER_SITE_OTHER): Payer: BC Managed Care – PPO

## 2013-12-17 VITALS — BP 131/76 | HR 62 | Temp 97.1°F | Resp 16

## 2013-12-17 DIAGNOSIS — Z9889 Other specified postprocedural states: Secondary | ICD-10-CM

## 2013-12-17 NOTE — Progress Notes (Unsigned)
1. Keller arthroplasty with single silicone implant right foot 2. Kenalog injection of the left heel

## 2013-12-17 NOTE — Progress Notes (Signed)
Routine post op , dos 1.9.15 , keller arthroplasty with single silicone implant right foot and injection in the left heel, "feels pretty good" he denies fever chills nausea vomiting muscle aches and pains.  Objective: Vital signs are stable he is alert and oriented x3. Dry sterile dressing was intact and once removed demonstrates minimal edema no erythema cellulitis drainage or odor. He has good active range of motion on plantar flexion as well as dorsiflexion mildly tender on dorsiflexion. Radiographic evaluation demonstrates moderate edema overlying the first metatarsal phalangeal joint area of the right foot. Keller arthroplasty with single silicone implant is in good alignment. Margins appear to be well coapted on physical exam with good range of motion.  Assessment well-healing surgical foot status post Lorenz CoasterKeller arthroplasty single silicone implant right foot date of surgery was 12/12/2013.  Plan: redressed the right foot today with a dry sterile compressive dressing. Encouraged dorsiflexion and plantar flexion range of motion. Encouraged him to continue to ice this and keep it elevated as much as possible. I will followup with him in one week hopefully for suture removal at that time. However if there's any dehisced since and all we will leave them in the

## 2013-12-18 ENCOUNTER — Encounter: Payer: BC Managed Care – PPO | Admitting: Podiatry

## 2013-12-19 ENCOUNTER — Other Ambulatory Visit: Payer: Self-pay | Admitting: Family Medicine

## 2013-12-19 DIAGNOSIS — I10 Essential (primary) hypertension: Secondary | ICD-10-CM

## 2013-12-19 DIAGNOSIS — Z125 Encounter for screening for malignant neoplasm of prostate: Secondary | ICD-10-CM

## 2013-12-23 ENCOUNTER — Other Ambulatory Visit (INDEPENDENT_AMBULATORY_CARE_PROVIDER_SITE_OTHER): Payer: BC Managed Care – PPO

## 2013-12-23 DIAGNOSIS — I10 Essential (primary) hypertension: Secondary | ICD-10-CM

## 2013-12-23 DIAGNOSIS — Z125 Encounter for screening for malignant neoplasm of prostate: Secondary | ICD-10-CM

## 2013-12-23 LAB — COMPREHENSIVE METABOLIC PANEL
ALT: 38 U/L (ref 0–53)
AST: 27 U/L (ref 0–37)
Albumin: 4.2 g/dL (ref 3.5–5.2)
Alkaline Phosphatase: 69 U/L (ref 39–117)
BILIRUBIN TOTAL: 0.9 mg/dL (ref 0.3–1.2)
BUN: 26 mg/dL — ABNORMAL HIGH (ref 6–23)
CHLORIDE: 103 meq/L (ref 96–112)
CO2: 26 mEq/L (ref 19–32)
Calcium: 9.7 mg/dL (ref 8.4–10.5)
Creatinine, Ser: 1.3 mg/dL (ref 0.4–1.5)
GFR: 60.93 mL/min (ref >60.00)
Glucose, Bld: 91 mg/dL (ref 70–99)
Potassium: 4.7 mEq/L (ref 3.5–5.1)
SODIUM: 138 meq/L (ref 135–145)
TOTAL PROTEIN: 7 g/dL (ref 6.0–8.3)

## 2013-12-23 LAB — LIPID PANEL
Cholesterol: 154 mg/dL (ref 0–200)
HDL: 32 mg/dL — ABNORMAL LOW (ref >39.00)
LDL Cholesterol: 96 mg/dL (ref 0–99)
Total CHOL/HDL Ratio: 5
Triglycerides: 132 mg/dL (ref 0.0–149.0)
VLDL: 26.4 mg/dL (ref 0.0–40.0)

## 2013-12-23 LAB — PSA: PSA: 0.68 ng/mL (ref 0.10–4.00)

## 2013-12-24 ENCOUNTER — Ambulatory Visit (INDEPENDENT_AMBULATORY_CARE_PROVIDER_SITE_OTHER): Payer: BC Managed Care – PPO | Admitting: Podiatry

## 2013-12-24 ENCOUNTER — Encounter: Payer: Self-pay | Admitting: Podiatry

## 2013-12-24 VITALS — BP 130/84 | HR 72 | Temp 97.4°F | Resp 16

## 2013-12-24 DIAGNOSIS — Z9889 Other specified postprocedural states: Secondary | ICD-10-CM

## 2013-12-24 NOTE — Progress Notes (Signed)
Post op 1.9.15 , pt states it seems to be doing pretty good , keller arthroplasty right foot. He denies fever chills nausea vomiting muscle aches or pains. He states that he has been exercising at some. He is now status post 2 weeks Keller arthroplasty single silicone implant right foot.  Objective: Vital signs are stable he is alert and oriented x3. Dry sterile dressing was intact. Once removed demonstrates mild to moderate edema first intermetatarsal space no erythema cellulitis drainage or odor. He has fantastic plantar flexion dorsiflexion is somewhat limited and tender on sharp dorsiflexion. Radiographic evaluation not taken today.  Assessment: Well-healing surgical foot right status post Keller arthroplasty 12/12/2013.  Plan: He is doing very well for 2 weeks out. I have encouraged him to increase his range of motion particularly dorsiflexion. A put him in a compression anklet L. start getting this wet and he is now Darco shoe. I will followup with him in 2 weeks for another set of x-rays

## 2013-12-25 ENCOUNTER — Encounter: Payer: Self-pay | Admitting: Family Medicine

## 2013-12-25 ENCOUNTER — Ambulatory Visit (INDEPENDENT_AMBULATORY_CARE_PROVIDER_SITE_OTHER): Payer: BC Managed Care – PPO | Admitting: Family Medicine

## 2013-12-25 VITALS — BP 120/78 | HR 73 | Temp 98.4°F | Ht 72.0 in | Wt 192.5 lb

## 2013-12-25 DIAGNOSIS — Z0000 Encounter for general adult medical examination without abnormal findings: Secondary | ICD-10-CM

## 2013-12-25 DIAGNOSIS — I10 Essential (primary) hypertension: Secondary | ICD-10-CM

## 2013-12-25 DIAGNOSIS — F411 Generalized anxiety disorder: Secondary | ICD-10-CM

## 2013-12-25 DIAGNOSIS — E7800 Pure hypercholesterolemia, unspecified: Secondary | ICD-10-CM

## 2013-12-25 MED ORDER — BISOPROLOL-HYDROCHLOROTHIAZIDE 5-6.25 MG PO TABS: ORAL_TABLET | ORAL | Status: DC

## 2013-12-25 MED ORDER — PAROXETINE HCL 20 MG PO TABS: 20.0000 mg | ORAL_TABLET | ORAL | Status: DC

## 2013-12-25 MED ORDER — VERAPAMIL HCL ER 240 MG PO TBCR: 240.0000 mg | EXTENDED_RELEASE_TABLET | Freq: Every day | ORAL | Status: DC

## 2013-12-25 MED ORDER — ROSUVASTATIN CALCIUM 10 MG PO TABS: ORAL_TABLET | ORAL | Status: DC

## 2013-12-25 NOTE — Progress Notes (Signed)
Pre-visit discussion using our clinic review tool. No additional management support is needed unless otherwise documented below in the visit note.  CPE- See plan.  Routine anticipatory guidance given to patient.  See health maintenance. Tetanus 2006 Flu shot 2014 Colonoscopy 2010 PSA normal, d/w pt.  Living will d/w pt.  Wife designated.   Diet and exercise d/w pt.  Exercise limited with work and foot surgery.    Hypertension:    Using medication without problems or lightheadedness: yes Chest pain with exertion:no Edema:no Short of breath:no  Elevated Cholesterol: Using medications without problems:yes Muscle aches: no Diet compliance: encouraged.  Exercise:discussed Labs d/w pt.   Migraines improved on paxil.  Would like to continue.  Less anxiety attacks on the medicine.    PMH and SH reviewed  Meds, vitals, and allergies reviewed.   ROS: See HPI.  Otherwise negative.    GEN: nad, alert and oriented HEENT: mucous membranes moist NECK: supple w/o LA CV: rrr. PULM: ctab, no inc wob ABD: soft, +bs EXT: no edema SKIN: no acute rash R foot in a boot per surgery.

## 2013-12-25 NOTE — Assessment & Plan Note (Signed)
Controlled, continue current meds.   

## 2013-12-25 NOTE — Assessment & Plan Note (Signed)
Routine anticipatory guidance given to patient.  See health maintenance. Tetanus 2006 Flu shot 2014 Colonoscopy 2010 PSA normal, d/w pt.  Living will d/w pt.  Wife designated.   Diet and exercise d/w pt.  Exercise limited with work and foot surgery.

## 2013-12-25 NOTE — Patient Instructions (Signed)
Take care.  Don't change your meds.  Glad to see you.  Recheck in 1 year, sooner if needed.

## 2013-12-26 ENCOUNTER — Telehealth: Payer: Self-pay | Admitting: Family Medicine

## 2013-12-26 NOTE — Telephone Encounter (Signed)
Relevant patient education assigned to patient using Emmi. ° °

## 2013-12-30 ENCOUNTER — Encounter: Payer: BC Managed Care – PPO | Admitting: Family Medicine

## 2014-01-07 ENCOUNTER — Encounter: Payer: Self-pay | Admitting: Podiatry

## 2014-01-07 ENCOUNTER — Ambulatory Visit (INDEPENDENT_AMBULATORY_CARE_PROVIDER_SITE_OTHER): Payer: BC Managed Care – PPO | Admitting: Podiatry

## 2014-01-07 ENCOUNTER — Ambulatory Visit (INDEPENDENT_AMBULATORY_CARE_PROVIDER_SITE_OTHER): Payer: BC Managed Care – PPO

## 2014-01-07 VITALS — BP 129/81 | HR 71 | Resp 16

## 2014-01-07 DIAGNOSIS — Z9889 Other specified postprocedural states: Secondary | ICD-10-CM

## 2014-01-07 NOTE — Progress Notes (Signed)
Dos 1.9.15 , right foot keller arthroplasty , its doing great, ready to ditch the boot.  Objective: Vital signs are stable he is alert oriented x3. Minimal edema no erythema saline is drainage or odor to the first metatarsophalangeal joint of the right foot status post Keller arthroplasty one month ago.  Assessment: Well-healing surgical foot.  Plan: Bonita QuinLinda back into her regular pair shoes I will followup with him in one month for another set of x-rays

## 2014-02-04 ENCOUNTER — Encounter: Payer: Self-pay | Admitting: Podiatry

## 2014-02-04 ENCOUNTER — Ambulatory Visit (INDEPENDENT_AMBULATORY_CARE_PROVIDER_SITE_OTHER): Payer: BC Managed Care – PPO

## 2014-02-04 ENCOUNTER — Ambulatory Visit (INDEPENDENT_AMBULATORY_CARE_PROVIDER_SITE_OTHER): Payer: BC Managed Care – PPO | Admitting: Podiatry

## 2014-02-04 VITALS — BP 122/74 | HR 74 | Resp 16 | Ht 72.0 in | Wt 188.0 lb

## 2014-02-04 DIAGNOSIS — Z9889 Other specified postprocedural states: Secondary | ICD-10-CM

## 2014-02-04 NOTE — Progress Notes (Signed)
We will presents today for followup of his first metatarsophalangeal joint arthroplasty with single silicone implant right he states that it hurts a little bit in here as he points to the dorsal aspect of the proximal phalanx. He states he also feels a click or pop on the plantar aspect as he points to the plantar aspect of the first metatarsophalangeal joint right foot.  Objective: Vital signs are stable he is alert and oriented x3. Pulses are palpable bilateral. He has fantastic range of motion of the first metatarsophalangeal joint. Radiographic evaluation does not demonstrate any type of osseous abnormalities in this area whatsoever.  Assessment: Well-healing surgical foot right.  Plan: Continue physical therapy and range of motion exercises I will followup with him in 2 months.

## 2014-05-20 ENCOUNTER — Ambulatory Visit (INDEPENDENT_AMBULATORY_CARE_PROVIDER_SITE_OTHER): Payer: BC Managed Care – PPO | Admitting: Family Medicine

## 2014-05-20 ENCOUNTER — Encounter: Payer: Self-pay | Admitting: Family Medicine

## 2014-05-20 VITALS — BP 112/68 | HR 64 | Temp 97.9°F | Wt 197.5 lb

## 2014-05-20 DIAGNOSIS — M5430 Sciatica, unspecified side: Secondary | ICD-10-CM

## 2014-05-20 MED ORDER — IBUPROFEN 600 MG PO TABS: 600.0000 mg | ORAL_TABLET | Freq: Three times a day (TID) | ORAL | Status: DC | PRN

## 2014-05-20 MED ORDER — CYCLOBENZAPRINE HCL 10 MG PO TABS: 10.0000 mg | ORAL_TABLET | Freq: Three times a day (TID) | ORAL | Status: DC | PRN

## 2014-05-20 MED ORDER — HYDROCODONE-ACETAMINOPHEN 5-325 MG PO TABS: 1.0000 | ORAL_TABLET | Freq: Four times a day (QID) | ORAL | Status: DC | PRN

## 2014-05-20 NOTE — Progress Notes (Signed)
Pre visit review using our clinic review tool, if applicable. No additional management support is needed unless otherwise documented below in the visit note.  He was doing some lifting prev, didn't have troubles at the time.  No trauma. Back pain after the fact, after the lifting.  Pain started about 5 days ago.  Lower back.  Not in his legs usually, occ L posterior and calf ache, but not a radicular pain.  No R leg sx.  Worse pain in L lower back, just to side of midline. No rash.  No FCNAVD.  No B/B sx.  Pain moving.  Pain leaning forward.  Tried ibuprofen ~400mg  up to BID, no help.    Meds, vitals, and allergies reviewed.   ROS: See HPI.  Otherwise, noncontributory.  nad but uncomfortable rrr ctab abd soft Back ttp just to the L of the lower L spine No rash L SLR positive.   Distally NV intact, no weakness.

## 2014-05-20 NOTE — Patient Instructions (Signed)
Use flexeril and ibuprofen for now, with hydrocodone if needed for pain.  Sedation caution with flexeril and hydrocodone.  Take ibuprofen with food.  Use the back exercises.  Take care.  Notify us if not better.  Glad to see you.

## 2014-05-20 NOTE — Assessment & Plan Note (Signed)
Distally nv intact.  D/w pt.  Handout given re: exercises and stretching, use flexeril and NSAID with sedation and GI caution in meantime.  If still in pain, can use hydrocodone prn, sedation caution.  No need to image now.  Strength wnl for the BLE.  He agrees with plan.

## 2014-12-31 ENCOUNTER — Other Ambulatory Visit: Payer: Self-pay | Admitting: Family Medicine

## 2014-12-31 DIAGNOSIS — I10 Essential (primary) hypertension: Secondary | ICD-10-CM

## 2014-12-31 DIAGNOSIS — Z125 Encounter for screening for malignant neoplasm of prostate: Secondary | ICD-10-CM

## 2015-01-01 ENCOUNTER — Other Ambulatory Visit (INDEPENDENT_AMBULATORY_CARE_PROVIDER_SITE_OTHER): Payer: BLUE CROSS/BLUE SHIELD

## 2015-01-01 DIAGNOSIS — I10 Essential (primary) hypertension: Secondary | ICD-10-CM

## 2015-01-01 DIAGNOSIS — Z125 Encounter for screening for malignant neoplasm of prostate: Secondary | ICD-10-CM

## 2015-01-01 LAB — COMPREHENSIVE METABOLIC PANEL
ALT: 32 U/L (ref 0–53)
AST: 25 U/L (ref 0–37)
Albumin: 4.6 g/dL (ref 3.5–5.2)
Alkaline Phosphatase: 82 U/L (ref 39–117)
BUN: 15 mg/dL (ref 6–23)
CO2: 29 meq/L (ref 19–32)
Calcium: 9.8 mg/dL (ref 8.4–10.5)
Chloride: 104 mEq/L (ref 96–112)
Creatinine, Ser: 1.23 mg/dL (ref 0.40–1.50)
GFR: 64.71 mL/min (ref >60.00)
Glucose, Bld: 96 mg/dL (ref 70–99)
POTASSIUM: 3.9 meq/L (ref 3.5–5.1)
Sodium: 141 mEq/L (ref 135–145)
TOTAL PROTEIN: 6.8 g/dL (ref 6.0–8.3)
Total Bilirubin: 0.6 mg/dL (ref 0.2–1.2)

## 2015-01-01 LAB — LIPID PANEL
Cholesterol: 144 mg/dL (ref 0–200)
HDL: 29.3 mg/dL — AB (ref >39.00)
NonHDL: 114.7
Total CHOL/HDL Ratio: 5
Triglycerides: 223 mg/dL — ABNORMAL HIGH (ref 0.0–149.0)
VLDL: 44.6 mg/dL — ABNORMAL HIGH (ref 0.0–40.0)

## 2015-01-01 LAB — PSA: PSA: 0.74 ng/mL (ref 0.10–4.00)

## 2015-01-01 LAB — LDL CHOLESTEROL, DIRECT: Direct LDL: 78 mg/dL

## 2015-01-08 ENCOUNTER — Encounter: Payer: Self-pay | Admitting: Family Medicine

## 2015-01-08 ENCOUNTER — Ambulatory Visit (INDEPENDENT_AMBULATORY_CARE_PROVIDER_SITE_OTHER): Payer: BLUE CROSS/BLUE SHIELD | Admitting: Family Medicine

## 2015-01-08 VITALS — BP 142/86 | HR 64 | Temp 97.7°F | Ht 71.0 in | Wt 199.8 lb

## 2015-01-08 DIAGNOSIS — E7800 Pure hypercholesterolemia, unspecified: Secondary | ICD-10-CM

## 2015-01-08 DIAGNOSIS — Z7189 Other specified counseling: Secondary | ICD-10-CM

## 2015-01-08 DIAGNOSIS — Z0000 Encounter for general adult medical examination without abnormal findings: Secondary | ICD-10-CM

## 2015-01-08 DIAGNOSIS — Z23 Encounter for immunization: Secondary | ICD-10-CM

## 2015-01-08 DIAGNOSIS — I10 Essential (primary) hypertension: Secondary | ICD-10-CM

## 2015-01-08 DIAGNOSIS — F411 Generalized anxiety disorder: Secondary | ICD-10-CM

## 2015-01-08 MED ORDER — VERAPAMIL HCL ER 240 MG PO TBCR: 240.0000 mg | EXTENDED_RELEASE_TABLET | Freq: Every day | ORAL | Status: DC

## 2015-01-08 MED ORDER — PAROXETINE HCL 20 MG PO TABS: 20.0000 mg | ORAL_TABLET | ORAL | Status: DC

## 2015-01-08 MED ORDER — ROSUVASTATIN CALCIUM 10 MG PO TABS: ORAL_TABLET | ORAL | Status: DC

## 2015-01-08 MED ORDER — BISOPROLOL-HYDROCHLOROTHIAZIDE 5-6.25 MG PO TABS: ORAL_TABLET | ORAL | Status: DC

## 2015-01-08 NOTE — Patient Instructions (Signed)
Take care.  Glad to see you.  Work on diet and exercise.   Don't change your meds.   Recheck yearly.

## 2015-01-08 NOTE — Progress Notes (Signed)
Pre visit review using our clinic review tool, if applicable. No additional management support is needed unless otherwise documented below in the visit note.\  CPE- See plan.  Routine anticipatory guidance given to patient.  See health maintenance. Tetanus 2016 Flu today PNA and shingles not due yet.  PSA wnl Colonoscopy 2010 Diet and exercise d/w pt.  Encouraged both.  The "diet is tough, I got a Southern woman".  Exercise is "not good."   Living will d/w pt.  Wife would be designated if patient were incapacitated.    Hypertension:    Using medication without problems or lightheadedness: yes Chest pain with exertion:no Edema:no Short of breath:no  Elevated Cholesterol: Using medications without problems:yes Muscle aches: no Diet compliance: see above Exercise:see above Labs d/w pt.    Anxiety.  On paxil, no ADE.  He didn't want to stop med.  Compliant.  Good effect, less anxious.   PMH and SH reviewed  Meds, vitals, and allergies reviewed.   ROS: See HPI.  Otherwise negative.    GEN: nad, alert and oriented HEENT: mucous membranes moist NECK: supple w/o LA CV: rrr. PULM: ctab, no inc wob ABD: soft, +bs EXT: no edema SKIN: no acute rash Affect wnl.

## 2015-01-10 DIAGNOSIS — Z7189 Other specified counseling: Secondary | ICD-10-CM | POA: Insufficient documentation

## 2015-01-10 NOTE — Assessment & Plan Note (Signed)
Usually 135/70s per patient checks out of clinic.  Continue as is.  Work on diet and exercise, d/w pt.  No change in meds.  He agrees.  TG elevation noted, rx is diet and exercise.

## 2015-01-10 NOTE — Assessment & Plan Note (Signed)
Controlled, continue paxil, doing well.  He agrees.

## 2015-01-10 NOTE — Assessment & Plan Note (Signed)
Continue statin, work on diet and exercise, he agrees. TG elevation d/w pt.

## 2015-01-10 NOTE — Assessment & Plan Note (Signed)
Routine anticipatory guidance given to patient.  See health maintenance. Tetanus 2016 Flu today PNA and shingles not due yet.  PSA wnl Colonoscopy 2010 Diet and exercise d/w pt.  Encouraged both.  The "diet is tough, I got a [place] woman".  Exercise is "not good."   Living will d/w pt.  Wife would be designated if patient were incapacitated.

## 2015-04-07 ENCOUNTER — Encounter: Payer: Self-pay | Admitting: Gastroenterology

## 2015-10-13 ENCOUNTER — Ambulatory Visit: Payer: BLUE CROSS/BLUE SHIELD

## 2015-12-07 ENCOUNTER — Telehealth: Payer: Self-pay

## 2015-12-07 NOTE — Telephone Encounter (Signed)
Cost to pt $600 for 90 day supply of generic crestor at [facility] [address]. Pt request different med to [facility] [address]. Last annual 01/08/15. Advised pt to contact ins co to see what is more affordable approved med to to take the place of generic crestor. Pt voiced understanding and is not sure when he will cb.

## 2015-12-10 ENCOUNTER — Ambulatory Visit (INDEPENDENT_AMBULATORY_CARE_PROVIDER_SITE_OTHER): Payer: BLUE CROSS/BLUE SHIELD

## 2015-12-10 DIAGNOSIS — Z23 Encounter for immunization: Secondary | ICD-10-CM

## 2016-01-02 ENCOUNTER — Other Ambulatory Visit: Payer: Self-pay | Admitting: Family Medicine

## 2016-01-02 DIAGNOSIS — Z125 Encounter for screening for malignant neoplasm of prostate: Secondary | ICD-10-CM

## 2016-01-02 DIAGNOSIS — E7800 Pure hypercholesterolemia, unspecified: Secondary | ICD-10-CM

## 2016-01-06 ENCOUNTER — Other Ambulatory Visit (INDEPENDENT_AMBULATORY_CARE_PROVIDER_SITE_OTHER): Payer: BLUE CROSS/BLUE SHIELD

## 2016-01-06 DIAGNOSIS — Z125 Encounter for screening for malignant neoplasm of prostate: Secondary | ICD-10-CM

## 2016-01-06 DIAGNOSIS — E7800 Pure hypercholesterolemia, unspecified: Secondary | ICD-10-CM | POA: Diagnosis not present

## 2016-01-06 LAB — COMPREHENSIVE METABOLIC PANEL
ALT: 25 U/L (ref 0–53)
AST: 19 U/L (ref 0–37)
Albumin: 4.5 g/dL (ref 3.5–5.2)
Alkaline Phosphatase: 74 U/L (ref 39–117)
BUN: 20 mg/dL (ref 6–23)
CO2: 31 mEq/L (ref 19–32)
Calcium: 9.8 mg/dL (ref 8.4–10.5)
Chloride: 102 mEq/L (ref 96–112)
Creatinine, Ser: 1.27 mg/dL (ref 0.40–1.50)
GFR: 62.13 mL/min (ref >60.00)
Glucose, Bld: 100 mg/dL — ABNORMAL HIGH (ref 70–99)
Potassium: 4.7 mEq/L (ref 3.5–5.1)
Sodium: 141 mEq/L (ref 135–145)
Total Bilirubin: 0.7 mg/dL (ref 0.2–1.2)
Total Protein: 6.9 g/dL (ref 6.0–8.3)

## 2016-01-06 LAB — LIPID PANEL
Cholesterol: 160 mg/dL (ref 0–200)
HDL: 27.9 mg/dL — ABNORMAL LOW (ref >39.00)
NonHDL: 132.06
Total CHOL/HDL Ratio: 6
Triglycerides: 357 mg/dL — ABNORMAL HIGH (ref 0.0–149.0)
VLDL: 71.4 mg/dL — ABNORMAL HIGH (ref 0.0–40.0)

## 2016-01-06 LAB — LDL CHOLESTEROL, DIRECT: Direct LDL: 76 mg/dL

## 2016-01-06 LAB — PSA: PSA: 0.73 ng/mL (ref 0.10–4.00)

## 2016-01-11 ENCOUNTER — Ambulatory Visit (INDEPENDENT_AMBULATORY_CARE_PROVIDER_SITE_OTHER): Payer: BLUE CROSS/BLUE SHIELD | Admitting: Family Medicine

## 2016-01-11 ENCOUNTER — Encounter: Payer: Self-pay | Admitting: Family Medicine

## 2016-01-11 VITALS — BP 110/70 | HR 64 | Temp 97.6°F | Ht 71.0 in | Wt 198.5 lb

## 2016-01-11 DIAGNOSIS — Z119 Encounter for screening for infectious and parasitic diseases, unspecified: Secondary | ICD-10-CM

## 2016-01-11 DIAGNOSIS — R300 Dysuria: Secondary | ICD-10-CM

## 2016-01-11 DIAGNOSIS — Z0000 Encounter for general adult medical examination without abnormal findings: Secondary | ICD-10-CM | POA: Diagnosis not present

## 2016-01-11 DIAGNOSIS — E7800 Pure hypercholesterolemia, unspecified: Secondary | ICD-10-CM

## 2016-01-11 DIAGNOSIS — I10 Essential (primary) hypertension: Secondary | ICD-10-CM

## 2016-01-11 DIAGNOSIS — R739 Hyperglycemia, unspecified: Secondary | ICD-10-CM

## 2016-01-11 LAB — POC URINALSYSI DIPSTICK (AUTOMATED)
BILIRUBIN UA: NEGATIVE
Blood, UA: NEGATIVE
GLUCOSE UA: NEGATIVE
Ketones, UA: NEGATIVE
LEUKOCYTES UA: NEGATIVE
NITRITE UA: NEGATIVE
Protein, UA: NEGATIVE
Spec Grav, UA: 1.025
Urobilinogen, UA: 4
pH, UA: 6

## 2016-01-11 MED ORDER — VERAPAMIL HCL ER 240 MG PO TBCR: 240.0000 mg | EXTENDED_RELEASE_TABLET | Freq: Every day | ORAL | Status: DC

## 2016-01-11 MED ORDER — OMEPRAZOLE MAGNESIUM 20 MG PO TBEC: 20.0000 mg | DELAYED_RELEASE_TABLET | Freq: Every day | ORAL | Status: DC

## 2016-01-11 MED ORDER — PAROXETINE HCL 20 MG PO TABS: 20.0000 mg | ORAL_TABLET | ORAL | Status: DC

## 2016-01-11 MED ORDER — SIMVASTATIN 40 MG PO TABS: 40.0000 mg | ORAL_TABLET | Freq: Every day | ORAL | Status: DC

## 2016-01-11 MED ORDER — BISOPROLOL-HYDROCHLOROTHIAZIDE 5-6.25 MG PO TABS: ORAL_TABLET | ORAL | Status: DC

## 2016-01-11 MED ORDER — DOCUSATE SODIUM 100 MG PO CAPS: 100.0000 mg | ORAL_CAPSULE | Freq: Every day | ORAL | Status: DC | PRN

## 2016-01-11 NOTE — Patient Instructions (Signed)
Simvastatin.  Start  a day.  If you have aches, then cut back to  a day. If still with aches, then stop it and notify me to try to get crestor approved.  Taper caffeine and use the eat right diet.  Take care. Glad to see you.

## 2016-01-11 NOTE — Progress Notes (Signed)
Pre visit review using our clinic review tool, if applicable. No additional management support is needed unless otherwise documented below in the visit note.  CPE- See plan.  Routine anticipatory guidance given to patient.  See health maintenance. Tetanus 2016 Flu today PNA and shingles not due yet.  PSA wnl Colonoscopy 2010 Diet and exercise d/w pt. Encouraged both. Exercise is variable with work.  Diet is not great per patient report.  D/w pt about low carb diet and handout given.   Living will d/w pt. Wife would be designated if patient were incapacitated.  Pt opts in for HCV and HIV screening.  D/w pt re: routine screening.  Will order for future labs.   Constipation.  D/w pt about inc in fiber vs meds.   Urinary frequency/urgency.  A lot of caffeine daily, d/w pt.  Prev with some burning about 3-4 days ago, for one day.  Resolved now.   U/a wnl, d/w pt at OV.   Hypertension:    Using medication without problems or lightheadedness: yes Chest pain with exertion:no Edema:no Short of breath:no  HLD.  Sig increase in cost for statin.  D/w pt about options.  See plan.    Elevated sugar.  "A lot of cokes" a day, ie >2L per day.  D/w pt about low carb diet.   PMH and SH reviewed  Meds, vitals, and allergies reviewed.   ROS: See HPI.  Otherwise negative.    GEN: nad, alert and oriented HEENT: mucous membranes moist NECK: supple w/o LA CV: rrr. PULM: ctab, no inc wob ABD: soft, +bs EXT: no edema SKIN: no acute rash

## 2016-01-12 NOTE — Assessment & Plan Note (Signed)
D/w pt about diet and exercise, cutting out sodas.  Handout re: low carb diet given to patient.

## 2016-01-12 NOTE — Assessment & Plan Note (Signed)
Labs d/w pt.  Needs diet and exercise, change to simvastatin for cost and he'll see if he can tolerate.  If not, then we can work on [employer] for [employer].  D/w pt.  He agrees.

## 2016-01-12 NOTE — Assessment & Plan Note (Addendum)
Tetanus 2016  Flu today  PNA and shingles not due yet.  PSA wnl  Colonoscopy 2010  Diet and exercise d/w pt. Encouraged both. Exercise is variable with work. Diet is not great per patient report. D/w pt about low carb diet and handout given.  Living will d/w pt. Wife would be designated if patient were incapacitated.  Pt opts in for HCV and HIV screening. D/w pt re: routine screening. Will order for future labs.  Constipation noted, d/w pt about inc in fiber vs meds.  Can use colace if needed.   Urinary frequency/urgency likely caffeine related, d/w pt.

## 2016-01-12 NOTE — Assessment & Plan Note (Signed)
Controlled, needs diet and exercise, no change in meds.

## 2016-08-29 DIAGNOSIS — L500 Allergic urticaria: Secondary | ICD-10-CM | POA: Diagnosis not present

## 2016-08-29 DIAGNOSIS — Z23 Encounter for immunization: Secondary | ICD-10-CM | POA: Diagnosis not present

## 2016-12-29 ENCOUNTER — Other Ambulatory Visit: Payer: Self-pay | Admitting: Family Medicine

## 2016-12-29 DIAGNOSIS — Z119 Encounter for screening for infectious and parasitic diseases, unspecified: Secondary | ICD-10-CM

## 2017-01-01 ENCOUNTER — Encounter: Payer: Self-pay | Admitting: Family Medicine

## 2017-01-01 ENCOUNTER — Other Ambulatory Visit: Payer: Self-pay | Admitting: Family Medicine

## 2017-01-01 DIAGNOSIS — Z125 Encounter for screening for malignant neoplasm of prostate: Secondary | ICD-10-CM

## 2017-01-01 DIAGNOSIS — I10 Essential (primary) hypertension: Secondary | ICD-10-CM

## 2017-01-01 DIAGNOSIS — Z8042 Family history of malignant neoplasm of prostate: Secondary | ICD-10-CM | POA: Insufficient documentation

## 2017-01-01 DIAGNOSIS — Z119 Encounter for screening for infectious and parasitic diseases, unspecified: Secondary | ICD-10-CM

## 2017-01-11 ENCOUNTER — Other Ambulatory Visit (INDEPENDENT_AMBULATORY_CARE_PROVIDER_SITE_OTHER): Payer: BLUE CROSS/BLUE SHIELD

## 2017-01-11 DIAGNOSIS — R7989 Other specified abnormal findings of blood chemistry: Secondary | ICD-10-CM | POA: Diagnosis not present

## 2017-01-11 DIAGNOSIS — Z125 Encounter for screening for malignant neoplasm of prostate: Secondary | ICD-10-CM

## 2017-01-11 DIAGNOSIS — Z119 Encounter for screening for infectious and parasitic diseases, unspecified: Secondary | ICD-10-CM

## 2017-01-11 DIAGNOSIS — I10 Essential (primary) hypertension: Secondary | ICD-10-CM | POA: Diagnosis not present

## 2017-01-11 LAB — COMPREHENSIVE METABOLIC PANEL
ALBUMIN: 4.4 g/dL (ref 3.5–5.2)
ALT: 24 U/L (ref 0–53)
AST: 19 U/L (ref 0–37)
Alkaline Phosphatase: 60 U/L (ref 39–117)
BUN: 15 mg/dL (ref 6–23)
CHLORIDE: 104 meq/L (ref 96–112)
CO2: 34 meq/L — AB (ref 19–32)
CREATININE: 1.17 mg/dL (ref 0.40–1.50)
Calcium: 9.5 mg/dL (ref 8.4–10.5)
GFR: 68.06 mL/min (ref >60.00)
GLUCOSE: 99 mg/dL (ref 70–99)
POTASSIUM: 4.4 meq/L (ref 3.5–5.1)
SODIUM: 143 meq/L (ref 135–145)
Total Bilirubin: 0.5 mg/dL (ref 0.2–1.2)
Total Protein: 6.5 g/dL (ref 6.0–8.3)

## 2017-01-11 LAB — LIPID PANEL
Cholesterol: 165 mg/dL (ref 0–200)
HDL: 26.5 mg/dL — AB (ref >39.00)
Total CHOL/HDL Ratio: 6
Triglycerides: 432 mg/dL — ABNORMAL HIGH (ref 0.0–149.0)

## 2017-01-11 LAB — PSA: PSA: 0.74 ng/mL (ref 0.10–4.00)

## 2017-01-11 LAB — LDL CHOLESTEROL, DIRECT: LDL DIRECT: 57 mg/dL

## 2017-01-12 LAB — HEPATITIS C ANTIBODY: HCV Ab: NEGATIVE

## 2017-01-12 LAB — HIV ANTIBODY (ROUTINE TESTING W REFLEX): HIV: NONREACTIVE

## 2017-01-15 ENCOUNTER — Encounter: Payer: Self-pay | Admitting: Family Medicine

## 2017-01-15 ENCOUNTER — Ambulatory Visit (INDEPENDENT_AMBULATORY_CARE_PROVIDER_SITE_OTHER): Payer: BLUE CROSS/BLUE SHIELD | Admitting: Family Medicine

## 2017-01-15 VITALS — BP 120/74 | HR 74 | Temp 98.6°F | Ht 71.0 in | Wt 200.0 lb

## 2017-01-15 DIAGNOSIS — Z0000 Encounter for general adult medical examination without abnormal findings: Secondary | ICD-10-CM

## 2017-01-15 DIAGNOSIS — R220 Localized swelling, mass and lump, head: Secondary | ICD-10-CM

## 2017-01-15 DIAGNOSIS — K219 Gastro-esophageal reflux disease without esophagitis: Secondary | ICD-10-CM

## 2017-01-15 DIAGNOSIS — R868 Other abnormal findings in specimens from male genital organs: Secondary | ICD-10-CM | POA: Diagnosis not present

## 2017-01-15 DIAGNOSIS — I10 Essential (primary) hypertension: Secondary | ICD-10-CM

## 2017-01-15 LAB — POC URINALSYSI DIPSTICK (AUTOMATED)
Bilirubin, UA: NEGATIVE
Blood, UA: NEGATIVE
Glucose, UA: NEGATIVE
Ketones, UA: NEGATIVE
Leukocytes, UA: NEGATIVE
Nitrite, UA: NEGATIVE
PROTEIN UA: NEGATIVE
SPEC GRAV UA: 1.025
UROBILINOGEN UA: 0.2
pH, UA: 6

## 2017-01-15 MED ORDER — SIMVASTATIN 40 MG PO TABS: 40.0000 mg | ORAL_TABLET | Freq: Every day | ORAL | 3 refills | Status: DC

## 2017-01-15 MED ORDER — BISOPROLOL-HYDROCHLOROTHIAZIDE 5-6.25 MG PO TABS: ORAL_TABLET | ORAL | 3 refills | Status: DC

## 2017-01-15 MED ORDER — VERAPAMIL HCL ER 240 MG PO TBCR: 240.0000 mg | EXTENDED_RELEASE_TABLET | Freq: Every day | ORAL | 3 refills | Status: DC

## 2017-01-15 MED ORDER — PAROXETINE HCL 20 MG PO TABS: 20.0000 mg | ORAL_TABLET | ORAL | 3 refills | Status: DC

## 2017-01-15 NOTE — Progress Notes (Signed)
CPE- See plan.  Routine anticipatory guidance given to patient.  See health maintenance.  Tetanus 2016 Flu 2017 PNA and shingles not due yet.  PSA wnl, d/w pt.   Colonoscopy 2010 Diet and exercise d/w pt. Encouraged both. Exercise is variable with work.  "I've failed miserably with diet and exercise" due to work.  Living will d/w pt. Wife would be designated if patient were incapacitated.  HIV and HCV screen d/w pt.  Both neg.    He has darker/brown semen recent noted twice.  No pain. No testicle pain.  No FCNAVD.  No burning with urination.   Stream is still okay.    Prev hives and lip swelling.  He was seen at urgent care. No clear trigger. Not on ACE or ARB medication. No sx in the meantime. Requesting records.  Paxil helps with migraines, much fever HA now, <1/3 of prev frequency.  Hypertension:    Using medication without problems or lightheadedness: yes Chest pain with exertion:no Edema:no Short of breath:no  He has occ vertigo but not presyncope with getting out of bed quickly.  It can happen with rolling over in bed.    GERD.  Controlled with PPI q3 days.  Less heartburn and less constipation with q3 day dosing.    Elevated Cholesterol: Using medications without problems:yes Muscle aches: no Diet compliance:encouraged Exercise:encouraged  PMH and SH reviewed  Meds, vitals, and allergies reviewed.   ROS: Per HPI.  Unless specifically indicated otherwise in HPI, the patient denies:  General: fever. Eyes: acute vision changes ENT: sore throat Cardiovascular: chest pain Respiratory: SOB GI: vomiting GU: dysuria Musculoskeletal: acute back pain Derm: acute rash Neuro: acute motor dysfunction Psych: worsening mood Endocrine: polydipsia Heme: bleeding Allergy: hayfever  GEN: nad, alert and oriented HEENT: mucous membranes moist, no lip or tongue swelling. No stridor. NECK: supple w/o LA CV: rrr. PULM: ctab, no inc wob ABD: soft, +bs EXT: no  edema SKIN: no acute rash Testes bilaterally descended without nodularity, tenderness or masses. No scrotal masses or lesions. No penis lesions or urethral discharge.

## 2017-01-15 NOTE — Patient Instructions (Addendum)
Consider trying nicotine gum to get off tobacco.   We'll be in touch about your urine results.  If you continue to have symptoms, then let me know.  We'll request your records.   If you have any more rash or swelling then let me know.  Take care.  Glad to see you.  Don't change your meds for now.

## 2017-01-15 NOTE — Progress Notes (Signed)
Pre visit review using our clinic review tool, if applicable. No additional management support is needed unless otherwise documented below in the visit note. 

## 2017-01-17 ENCOUNTER — Encounter: Payer: Self-pay | Admitting: Family Medicine

## 2017-01-17 DIAGNOSIS — R868 Other abnormal findings in specimens from male genital organs: Secondary | ICD-10-CM | POA: Insufficient documentation

## 2017-01-17 DIAGNOSIS — R220 Localized swelling, mass and lump, head: Secondary | ICD-10-CM | POA: Insufficient documentation

## 2017-01-17 NOTE — Assessment & Plan Note (Signed)
Controlled. Continue as is. 

## 2017-01-17 NOTE — Assessment & Plan Note (Signed)
Tetanus 2016 Flu 2017 PNA and shingles not due yet.  PSA wnl, d/w pt.   Colonoscopy 2010 Diet and exercise d/w pt. Encouraged both. Exercise is variable with work.  "I've failed miserably with diet and exercise" due to work.  Living will d/w pt. Wife would be designated if patient were incapacitated.  HIV and HCV screen d/w pt.  Both neg.

## 2017-01-17 NOTE — Assessment & Plan Note (Signed)
This may have been an incidental and now resolved issue. Urinalysis normal. Normal exam. If he has more symptoms he will let us know. He agrees.

## 2017-01-17 NOTE — Assessment & Plan Note (Signed)
Controlled.  Continue as is.  He agrees. 

## 2017-01-17 NOTE — Assessment & Plan Note (Signed)
H/o lip swelling- not from medication but would still avoid ACE/ARB meds.  Discussed with patient. Added to allergy list. Requesting records. No symptoms currently or since.

## 2017-02-09 ENCOUNTER — Telehealth: Payer: Self-pay

## 2017-02-09 NOTE — Telephone Encounter (Signed)
Pt contacted [facility] [address] and was advised no refills on verapamil, simvastatin,paroxetine and bisoprolol-HCTZ; I spoke with Misty at [facility] [address] and pt has available refills on all 4 meds and will be ready for pick up at lunch time. Pt appreciative and voiced understanding.

## 2017-11-30 ENCOUNTER — Ambulatory Visit (INDEPENDENT_AMBULATORY_CARE_PROVIDER_SITE_OTHER): Payer: BLUE CROSS/BLUE SHIELD

## 2017-11-30 DIAGNOSIS — Z23 Encounter for immunization: Secondary | ICD-10-CM

## 2018-01-06 ENCOUNTER — Other Ambulatory Visit: Payer: Self-pay | Admitting: Family Medicine

## 2018-01-06 DIAGNOSIS — I10 Essential (primary) hypertension: Secondary | ICD-10-CM

## 2018-01-06 DIAGNOSIS — Z125 Encounter for screening for malignant neoplasm of prostate: Secondary | ICD-10-CM

## 2018-01-10 ENCOUNTER — Other Ambulatory Visit (INDEPENDENT_AMBULATORY_CARE_PROVIDER_SITE_OTHER): Payer: BLUE CROSS/BLUE SHIELD

## 2018-01-10 DIAGNOSIS — I10 Essential (primary) hypertension: Secondary | ICD-10-CM

## 2018-01-10 DIAGNOSIS — Z125 Encounter for screening for malignant neoplasm of prostate: Secondary | ICD-10-CM

## 2018-01-10 LAB — COMPREHENSIVE METABOLIC PANEL
ALBUMIN: 4.3 g/dL (ref 3.5–5.2)
ALT: 17 U/L (ref 0–53)
AST: 14 U/L (ref 0–37)
Alkaline Phosphatase: 66 U/L (ref 39–117)
BUN: 19 mg/dL (ref 6–23)
CALCIUM: 9.5 mg/dL (ref 8.4–10.5)
CHLORIDE: 104 meq/L (ref 96–112)
CO2: 31 mEq/L (ref 19–32)
Creatinine, Ser: 1.28 mg/dL (ref 0.40–1.50)
GFR: 61.14 mL/min (ref >60.00)
Glucose, Bld: 93 mg/dL (ref 70–99)
POTASSIUM: 4.3 meq/L (ref 3.5–5.1)
SODIUM: 142 meq/L (ref 135–145)
Total Bilirubin: 0.7 mg/dL (ref 0.2–1.2)
Total Protein: 6.7 g/dL (ref 6.0–8.3)

## 2018-01-10 LAB — LIPID PANEL
CHOL/HDL RATIO: 6
CHOLESTEROL: 154 mg/dL (ref 0–200)
HDL: 25.8 mg/dL — AB (ref >39.00)
NonHDL: 128.1
TRIGLYCERIDES: 251 mg/dL — AB (ref 0.0–149.0)
VLDL: 50.2 mg/dL — AB (ref 0.0–40.0)

## 2018-01-10 LAB — LDL CHOLESTEROL, DIRECT: Direct LDL: 79 mg/dL

## 2018-01-10 LAB — PSA: PSA: 0.76 ng/mL (ref 0.10–4.00)

## 2018-01-21 ENCOUNTER — Ambulatory Visit (INDEPENDENT_AMBULATORY_CARE_PROVIDER_SITE_OTHER)
Admission: RE | Admit: 2018-01-21 | Discharge: 2018-01-21 | Disposition: A | Discharge status: Home / Self Care | Payer: BLUE CROSS/BLUE SHIELD | Source: Ambulatory Visit | Attending: Family Medicine | Admitting: Family Medicine

## 2018-01-21 ENCOUNTER — Encounter: Payer: Self-pay | Admitting: Family Medicine

## 2018-01-21 ENCOUNTER — Ambulatory Visit (INDEPENDENT_AMBULATORY_CARE_PROVIDER_SITE_OTHER): Payer: BLUE CROSS/BLUE SHIELD | Admitting: Family Medicine

## 2018-01-21 VITALS — BP 130/70 | HR 62 | Temp 98.5°F | Ht 71.0 in | Wt 201.5 lb

## 2018-01-21 DIAGNOSIS — I10 Essential (primary) hypertension: Secondary | ICD-10-CM

## 2018-01-21 DIAGNOSIS — Z7189 Other specified counseling: Secondary | ICD-10-CM

## 2018-01-21 DIAGNOSIS — G43109 Migraine with aura, not intractable, without status migrainosus: Secondary | ICD-10-CM

## 2018-01-21 DIAGNOSIS — M545 Low back pain: Secondary | ICD-10-CM

## 2018-01-21 DIAGNOSIS — R200 Anesthesia of skin: Secondary | ICD-10-CM | POA: Diagnosis not present

## 2018-01-21 DIAGNOSIS — M25511 Pain in right shoulder: Secondary | ICD-10-CM

## 2018-01-21 DIAGNOSIS — Z0000 Encounter for general adult medical examination without abnormal findings: Secondary | ICD-10-CM | POA: Diagnosis not present

## 2018-01-21 DIAGNOSIS — E7800 Pure hypercholesterolemia, unspecified: Secondary | ICD-10-CM

## 2018-01-21 DIAGNOSIS — F411 Generalized anxiety disorder: Secondary | ICD-10-CM

## 2018-01-21 DIAGNOSIS — K219 Gastro-esophageal reflux disease without esophagitis: Secondary | ICD-10-CM

## 2018-01-21 DIAGNOSIS — M542 Cervicalgia: Secondary | ICD-10-CM | POA: Diagnosis not present

## 2018-01-21 MED ORDER — VERAPAMIL HCL ER 240 MG PO TBCR: 240.0000 mg | EXTENDED_RELEASE_TABLET | Freq: Every day | ORAL | 3 refills | Status: DC

## 2018-01-21 MED ORDER — PAROXETINE HCL 20 MG PO TABS: 20.0000 mg | ORAL_TABLET | ORAL | 3 refills | Status: DC

## 2018-01-21 MED ORDER — SIMVASTATIN 40 MG PO TABS: 40.0000 mg | ORAL_TABLET | Freq: Every day | ORAL | 3 refills | Status: DC

## 2018-01-21 MED ORDER — BISOPROLOL-HYDROCHLOROTHIAZIDE 5-6.25 MG PO TABS: ORAL_TABLET | ORAL | 3 refills | Status: DC

## 2018-01-21 NOTE — Progress Notes (Signed)
CPE- See plan.  Routine anticipatory guidance given to patient.  See health maintenance.  The possibility exists that previously documented standard health maintenance information may have been brought forward from a previous encounter into this note.  If needed, that same information has been updated to reflect the current situation based on today's encounter.    Tetanus 2016 Flu 2018 PNA and shingles not due yet. d/w pt.   PSA wnl, d/w pt.   Colonoscopy 2010 Diet and exercise d/w pt. Encouraged both. Exercise is variable with work. Work is making it harder to stick to diet and exercise.  D/w pt.    Living will d/w pt. Wife would be designated if patient were incapacitated.  HIV and HCV prev neg.   Discussed quitting chewing tobacco, encouraged.    Hypertension:    Using medication without problems or lightheadedness: yes Chest pain with exertion:no Edema:no Short of breath:no  Elevated Cholesterol: Using medications without problems:yes Muscle aches: no Diet compliance: encouraged.   Exercise:encouraged Labs d/w pt.    Lower back pain not attributed to the statin.  More pain the longer he is up on his feet.  Not better with sitting.  Muscles tighten up in the lower back.  Occ/rare radicular pain to either side separately but not together.    R shoulder pain with abduction at 90 deg. Pain sleeping on the R at night.    He has B hand and arm numbness at night but not during the day except in R hand with prolonged driving.  No weakness.  R handed. Going on about 6 months.     Still on SSRI.  H/o anxiety, controlled.  He wanted to stay on the medicine, given his hx.  No SI/HI.    H/o migraines.  He does well as long as he is on his meds.  If he forgets all of his meds on day, he has a HA the next day.  Doing well overall.   He is trying to stay off PPI, has been off about 3 months, now used only PRN.  D/w pt.    PMH and SH reviewed  Meds, vitals, and allergies reviewed.    ROS: Per HPI.  Unless specifically indicated otherwise in HPI, the patient denies:  General: fever. Eyes: acute vision changes ENT: sore throat Cardiovascular: chest pain Respiratory: SOB GI: vomiting GU: dysuria Musculoskeletal: acute back pain Derm: acute rash Neuro: acute motor dysfunction Psych: worsening mood Endocrine: polydipsia Heme: bleeding Allergy: hayfever  GEN: nad, alert and oriented HEENT: mucous membranes moist NECK: supple w/o LA CV: rrr. PULM: ctab, no inc wob ABD: soft, +bs EXT: no edema SKIN: no acute rash B SLR neg Midline lower back ttp w/o rash or bruising.   R arm with pain on int and ext rotation.  R scap assist with ext rotation with less pain.  AC not ttp, no arm drop.   B grip and hand sensation and radial pulses wnl.  Tinel neg B at the wrists.

## 2018-01-21 NOTE — Patient Instructions (Addendum)
Go to the lab on the way out.  We'll contact you with your xray reports.   Martin LimerickMarion will call about your referral for PT.  Don't change your meds.   Get a hard OTC wrist brace and wear that at night.  See if that helps the hand/arm numbness.  If not, then let me know.  Take care.  Glad to see you.

## 2018-01-22 ENCOUNTER — Telehealth: Payer: Self-pay | Admitting: Family Medicine

## 2018-01-22 NOTE — Telephone Encounter (Signed)
Copied from CRM [id number]. Topic: Quick Communication - Other Results >> Jan 22, 2018 10:44 AM Herby AbrahamJohnson, Shiquita C wrote: Pt called in to get his imaging results   CB: [phone]

## 2018-01-22 NOTE — Telephone Encounter (Signed)
Spoke to pt and advised imaging results are back but have not been reviewed by dr Para Marchduncan. Informed pt he will be contacted with results once back

## 2018-01-23 DIAGNOSIS — M25511 Pain in right shoulder: Secondary | ICD-10-CM | POA: Insufficient documentation

## 2018-01-23 DIAGNOSIS — M545 Low back pain, unspecified: Secondary | ICD-10-CM | POA: Insufficient documentation

## 2018-01-23 DIAGNOSIS — G43109 Migraine with aura, not intractable, without status migrainosus: Secondary | ICD-10-CM | POA: Insufficient documentation

## 2018-01-23 DIAGNOSIS — M25519 Pain in unspecified shoulder: Secondary | ICD-10-CM | POA: Insufficient documentation

## 2018-01-23 DIAGNOSIS — R200 Anesthesia of skin: Secondary | ICD-10-CM | POA: Insufficient documentation

## 2018-01-23 NOTE — Assessment & Plan Note (Signed)
He is trying to stay off PPI, has been off about 3 months, now used only PRN.

## 2018-01-23 NOTE — Assessment & Plan Note (Signed)
Reasonable control.  Labs discussed with patient.  Continue work on diet and exercise.  He agrees. No change in meds.

## 2018-01-23 NOTE — Assessment & Plan Note (Signed)
  Living will d/w pt.  Wife would be designated if patient were incapacitated.   

## 2018-01-23 NOTE — Assessment & Plan Note (Signed)
He does well as long as he is on his meds.  If he forgets all of his meds on day, he has a HA the next day.  Doing well overall.  Continue as is.

## 2018-01-23 NOTE — Assessment & Plan Note (Signed)
Likely rotator cuff irritation.  Discussed with patient.  Refer to PT.  No need for imaging at this point.  Imaging would likely not change management now.  Discussed with patient, he agrees.  See after visit summary.

## 2018-01-23 NOTE — Assessment & Plan Note (Signed)
Reasonable control, TG still above goal but improved.  Labs discussed with patient.  Continue work on diet and exercise.  He agrees. No change in meds.

## 2018-01-23 NOTE — Assessment & Plan Note (Signed)
Sx controlled.  He wanted to stay on the medicine, given his hx.  No SI/HI.  Continue as is.

## 2018-01-23 NOTE — Assessment & Plan Note (Signed)
Check plain films of neck.  See notes on imaging.  Get a hard OTC wrist brace and wear that at night.  See if that helps the hand/arm numbness.  If not, then he can let me know. He agrees.

## 2018-01-23 NOTE — Assessment & Plan Note (Signed)
Tetanus 2016 Flu 2018 PNA and shingles not due yet. d/w pt.   PSA wnl, d/w pt.   Colonoscopy 2010 Diet and exercise d/w pt. Encouraged both. Exercise is variable with work. Work is making it harder to stick to diet and exercise.  D/w pt.    Living will d/w pt. Wife would be designated if patient were incapacitated.  HIV and HCV prev neg.   Discussed quitting chewing tobacco, encouraged.

## 2018-01-23 NOTE — Assessment & Plan Note (Signed)
Check plain films, refer to PT.  See notes on imaging.  He agrees.

## 2018-03-25 ENCOUNTER — Telehealth: Payer: Self-pay

## 2018-03-25 MED ORDER — VERAPAMIL HCL ER 120 MG PO TBCR: 240.0000 mg | EXTENDED_RELEASE_TABLET | Freq: Every day | ORAL | 3 refills | Status: DC

## 2018-03-25 NOTE — Telephone Encounter (Signed)
Patient advised.

## 2018-03-25 NOTE — Telephone Encounter (Signed)
rx changed to 120mg  tabs, take 2 at night to equal prev 240mg .  rx sent.  If this isn't available, then have patient let me know.  Thanks.

## 2018-03-25 NOTE — Telephone Encounter (Signed)
PLEASE NOTE: All timestamps contained within this report are represented as [place] Standard Time. CONFIDENTIALTY NOTICE: This fax transmission is intended only for the addressee. It contains information that is legally privileged, confidential or otherwise protected from use or disclosure. If you are not the intended recipient, you are strictly prohibited from reviewing, disclosing, copying using or disseminating any of this information or taking any action in reliance on or regarding this information. If you have received this fax in error, please notify us immediately by telephone so that we can arrange for its return to us. Phone: [phone], Toll-Free: [phone], Fax: [phone] Page: 1 of 2 Call Id: [id number] [facility] - Client TELEPHONE ADVICE RECORD [facility] Patient Name: Martin CorpusWILLIAM Magallon Gender: Male DOB: 1961-01-17 Age: [age] Y 2 M 6 D Return Phone Number: [phone] (Primary), [phone] (Secondary) Address: City[place]State[place]: [place] [id number] Client [facility]  [facility] Physician Raechel Acheuncan, Shaw - MD Contact Type Call Who Is Calling Patient / Member / Family / Caregiver Call Type Triage / Clinical Relationship To Patient Self Return Phone Number [phone] (Primary) Chief Complaint Prescription Refill or Medication Request (non symptomatic) Reason for Call Medication Question / Request Initial Comment Caller states he was told by the pharmacy that his medication is no longer being produced. The med is verapamil Translation No Nurse Assessment Nurse: Stefano GaulStringer, RN, Dwana CurdVera Date/Time (Eastern Time): 03/24/2018 10:26:51 AM Confirm and document reason for call. If symptomatic, describe symptoms. ---Caller states he has been taking verapamil 240 mg qd for HTN. he took his last pill today. He was told that verapamil is no longer being made. He  is leaving town this afternoon. he takes ziac with the verapamil. he will be back in town on Wednesday. He uses [facility] [employer]; [place]; phone: [phone] Does the patient have any new or worsening symptoms? ---No Please document clinical information provided and list any resource used. ---Advised caller that on call physician would be paged regarding medication. verbalized understanding. Guidelines Guideline Title Affirmed Question Affirmed Notes Nurse Date/Time (Eastern Time) Disp. Time Lamount Cohen(Eastern Time) Disposition Final User 03/24/2018 10:31:39 AM Called On-Call Provider Stefano GaulStringer, RN, Dwana CurdVera 03/24/2018 10:37:45 AM Clinical Call Yes Stefano GaulStringer, RN, Laverda SorensonVera Paging DoctorName Phone DateTime Result/Outcome Message Type Notes Clare GandySchmitz, Jeremy- MD [phone] 03/24/2018 10:31:39 AM Called On Call Provider - Reached Doctor Paged PLEASE NOTE: All timestamps contained within this report are represented as [place] Standard Time. CONFIDENTIALTY NOTICE: This fax transmission is intended only for the addressee. It contains information that is legally privileged, confidential or otherwise protected from use or disclosure. If you are not the intended recipient, you are strictly prohibited from reviewing, disclosing, copying using or disseminating any of this information or taking any action in reliance on or regarding this information. If you have received this fax in error, please notify us immediately by telephone so that we can arrange for its return to us. Phone: [phone], Toll-Free: [phone], Fax: [phone] Page: 2 of 2 Call Id: [id number] Paging DoctorName Phone DateTime Result/Outcome Message Type Notes Clare GandySchmitz, Jeremy- MD 03/24/2018 10:37:24 AM Spoke with On Call - General Message Result Called Dr. Clare GandyJeremy Schmitz on his cell phone and gave report that pt takes verapamil 240 mg daily and he takes Ziac with it. Went to the pharmacy to get refill today and was told  that the medication is not made anymore. He is leaving town today and will be back  in town on Wednesday. States that pt can call offfice tomorrow regarding medication or when he gets back in town. Pt notified and verbalized understanding.

## 2018-09-12 ENCOUNTER — Ambulatory Visit (INDEPENDENT_AMBULATORY_CARE_PROVIDER_SITE_OTHER): Payer: BLUE CROSS/BLUE SHIELD

## 2018-09-12 DIAGNOSIS — Z23 Encounter for immunization: Secondary | ICD-10-CM | POA: Diagnosis not present

## 2018-09-18 ENCOUNTER — Other Ambulatory Visit: Payer: Self-pay | Admitting: Podiatry

## 2018-09-18 ENCOUNTER — Ambulatory Visit (INDEPENDENT_AMBULATORY_CARE_PROVIDER_SITE_OTHER): Payer: BLUE CROSS/BLUE SHIELD | Admitting: Podiatry

## 2018-09-18 ENCOUNTER — Encounter: Payer: Self-pay | Admitting: Podiatry

## 2018-09-18 ENCOUNTER — Ambulatory Visit (INDEPENDENT_AMBULATORY_CARE_PROVIDER_SITE_OTHER): Payer: BLUE CROSS/BLUE SHIELD

## 2018-09-18 DIAGNOSIS — M722 Plantar fascial fibromatosis: Secondary | ICD-10-CM

## 2018-09-18 DIAGNOSIS — M778 Other enthesopathies, not elsewhere classified: Secondary | ICD-10-CM

## 2018-09-18 DIAGNOSIS — M779 Enthesopathy, unspecified: Principal | ICD-10-CM

## 2018-09-18 MED ORDER — MELOXICAM 15 MG PO TABS: 15.0000 mg | ORAL_TABLET | Freq: Every day | ORAL | 3 refills | Status: DC

## 2018-09-18 MED ORDER — METHYLPREDNISOLONE 4 MG PO TBPK: ORAL_TABLET | ORAL | 0 refills | Status: DC

## 2018-09-18 NOTE — Progress Notes (Signed)
Subjective:  Patient ID: Martin Cole, male    DOB: 23-Mar-1961,  MRN: [id number] HPI Chief Complaint  Patient presents with  . Foot Pain    Patient presents today for plantar fascial flareup of right heel x 1 month.  He states "it feels like a Im walking on a stone bruise"  He reports its very painful when getting up in the mornings. He also c/o of pain under left 4th toe on ball of foot x 5 weeks.  He states "it feels like a pin poking in my foot"  He has tried rolling foot on ball, orthotics and advil with no relief    57 y.o. male presents with the above complaint.   ROS: Denies fever chills nausea vomiting muscle aches pains calf pain back pain chest pain shortness of breath.  Past Medical History:  Diagnosis Date  . Anxiety   . GERD (gastroesophageal reflux disease)   . Headache(784.0)   . History of ETT 1999   wnl  . Hyperlipidemia   . Hypertension   . Lip swelling    With hives 2017. No clear trigger.  . Migraine aura without headache    Past Surgical History:  Procedure Laterality Date  . ESOPHAGOGASTRODUODENOSCOPY  10/18/98   esoph stricture, esophagitis, duodenitis  . ESOPHAGOGASTRODUODENOSCOPY  12/99   esoph dilation  . KNEE ARTHROSCOPY  1999   left knee  . KNEE ARTHROSCOPY  12/03   right  (Dr. Chaney Malling)  . SKIN CANCER EXCISION  1983  . TOE SURGERY Right    2015  . TONSILLECTOMY  1968    Current Outpatient Medications:  .  aspirin 81 MG tablet, Take 81 mg by mouth daily., Disp: , Rfl:  .  bisoprolol-hydrochlorothiazide (ZIAC) 5-6.25 MG tablet, TAKE ONE TABLET BY MOUTH ONCE DAILY, Disp: 90 tablet, Rfl: 3 .  docusate sodium (COLACE) 100 MG capsule, Take 1 capsule (100 mg total) by mouth daily as needed for mild constipation., Disp: , Rfl:  .  ibuprofen (ADVIL,MOTRIN) 600 MG tablet, Take 1 tablet (600 mg total) by mouth every 8 (eight) hours as needed (with food.)., Disp: 30 tablet, Rfl: 0 .  meloxicam (MOBIC) 15 MG tablet, Take 1 tablet (15 mg total) by  mouth daily., Disp: 30 tablet, Rfl: 3 .  methylPREDNISolone (MEDROL DOSEPAK) 4 MG TBPK tablet, 6 day dose pack - take as directed, Disp: 21 tablet, Rfl: 0 .  Multiple Vitamin (MULTIVITAMIN) tablet, Take 1 tablet by mouth daily., Disp: , Rfl:  .  omeprazole (PRILOSEC OTC) 20 MG tablet, Take 1 tablet (20 mg total) by mouth daily. (Patient taking differently: Take 20 mg by mouth as needed. ), Disp: 90 tablet, Rfl: 3 .  PARoxetine (PAXIL) 20 MG tablet, Take 1 tablet (20 mg total) by mouth every morning., Disp: 90 tablet, Rfl: 3 .  simvastatin (ZOCOR) 40 MG tablet, Take 1 tablet (40 mg total) by mouth at bedtime., Disp: 90 tablet, Rfl: 3 .  verapamil (CALAN-SR) 120 MG CR tablet, Take 2 tablets (240 mg total) by mouth at bedtime., Disp: 180 tablet, Rfl: 3  Allergies  Allergen Reactions  . Ace Inhibitors Other (See Comments)    H/o lip swelling- not from medication but would still avoid ACE/ARB meds.   . Angiotensin Receptor Blockers Other (See Comments)    H/o lip swelling- not from medication but would still avoid ACE/ARB meds.   . Lipitor [Atorvastatin]     Aches, but tolerated crestor and simvastatin   Review of Systems Objective:  There were no vitals filed for this visit.  General: Well developed, nourished, in no acute distress, alert and oriented x3   Dermatological: Skin is warm, dry and supple bilateral. Nails x 10 are well maintained; remaining integument appears unremarkable at this time. There are no open sores, no preulcerative lesions, no rash or signs of infection present.  Vascular: Dorsalis Pedis artery and Posterior Tibial artery pedal pulses are 2/4 bilateral with immedate capillary fill time. Pedal hair growth present. No varicosities and no lower extremity edema present bilateral.   Neruologic: Grossly intact via light touch bilateral. Vibratory intact via tuning fork bilateral. Protective threshold with Semmes Wienstein monofilament intact to all pedal sites bilateral.  Patellar and Achilles deep tendon reflexes 2+ bilateral. No Babinski or clonus noted bilateral.   Musculoskeletal: No gross boney pedal deformities bilateral. No pain, crepitus, or limitation noted with foot and ankle range of motion bilateral. Muscular strength 5/5 in all groups tested bilateral.  Gait: Unassisted, Nonantalgic.    Radiographs:  Radiographs demonstrate soft tissue increase in density plantar fashion calcaneal insertion site of the right heel left foot demonstrates elongated second metatarsal with osteoarthritis of the first metatarsal phalangeal joint.  Keller arthroplasty single silicone implant first metatarsophalangeal joint is intact.  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  After sterile Betadine skin prep I injected 10 mg Kenalog 5 mg Marcaine point maximal tenderness of the right heel 10 mg Kenalog 5 mg Marcaine second metatarsal space left foot.  Dispensed plantar fascial brace and a night splint.  Discussed appropriate shoe gear stretching exercise ice therapy sugar modifications.  Also wrote a prescription for Medrol Dosepak to be followed by meloxicam.  Follow-up with him in 1 month     Max T. [place], [place]

## 2018-09-18 NOTE — Patient Instructions (Signed)
For instructions on how to put on your Night Splint, please visit [email or web address]   Plantar Fasciitis (Heel Spur Syndrome) with Rehab The plantar fascia is a fibrous, ligament-like, soft-tissue structure that spans the bottom of the foot. Plantar fasciitis is a condition that causes pain in the foot due to inflammation of the tissue. SYMPTOMS   Pain and tenderness on the underneath side of the foot.  Pain that worsens with standing or walking. CAUSES  Plantar fasciitis is caused by irritation and injury to the plantar fascia on the underneath side of the foot. Common mechanisms of injury include:  Direct trauma to bottom of the foot.  Damage to a small nerve that runs under the foot where the main fascia attaches to the heel bone.  Stress placed on the plantar fascia due to bone spurs. RISK INCREASES WITH:   Activities that place stress on the plantar fascia (running, jumping, pivoting, or cutting).  Poor strength and flexibility.  Improperly fitted shoes.  Tight calf muscles.  Flat feet.  Failure to warm-up properly before activity.  Obesity. PREVENTION  Warm up and stretch properly before activity.  Allow for adequate recovery between workouts.  Maintain physical fitness:  Strength, flexibility, and endurance.  Cardiovascular fitness.  Maintain a health body weight.  Avoid stress on the plantar fascia.  Wear properly fitted shoes, including arch supports for individuals who have flat feet.  PROGNOSIS  If treated properly, then the symptoms of plantar fasciitis usually resolve without surgery. However, occasionally surgery is necessary.  RELATED COMPLICATIONS   Recurrent symptoms that may result in a chronic condition.  Problems of the lower back that are caused by compensating for the injury, such as limping.  Pain or weakness of the foot during push-off following surgery.  Chronic inflammation, scarring, and partial or complete fascia tear,  occurring more often from repeated injections.  TREATMENT  Treatment initially involves the use of ice and medication to help reduce pain and inflammation. The use of strengthening and stretching exercises may help reduce pain with activity, especially stretches of the Achilles tendon. These exercises may be performed at home or with a therapist. Your caregiver may recommend that you use heel cups of arch supports to help reduce stress on the plantar fascia. Occasionally, corticosteroid injections are given to reduce inflammation. If symptoms persist for greater than 6 months despite non-surgical (conservative), then surgery may be recommended.   MEDICATION   If pain medication is necessary, then nonsteroidal anti-inflammatory medications, such as aspirin and ibuprofen, or other minor pain relievers, such as acetaminophen, are often recommended.  Do not take pain medication within 7 days before surgery.  Prescription pain relievers may be given if deemed necessary by your caregiver. Use only as directed and only as much as you need.  Corticosteroid injections may be given by your caregiver. These injections should be reserved for the most serious cases, because they may only be given a certain number of times.  HEAT AND COLD  Cold treatment (icing) relieves pain and reduces inflammation. Cold treatment should be applied for 10 to 15 minutes every 2 to 3 hours for inflammation and pain and immediately after any activity that aggravates your symptoms. Use ice packs or massage the area with a piece of ice (ice massage).  Heat treatment may be used prior to performing the stretching and strengthening activities prescribed by your caregiver, physical therapist, or athletic trainer. Use a heat pack or soak the injury in warm water.  SEEK IMMEDIATE MEDICAL CARE   IF:  Treatment seems to offer no benefit, or the condition worsens.  Any medications produce adverse side effects.  EXERCISES- RANGE OF  MOTION (ROM) AND STRETCHING EXERCISES - Plantar Fasciitis (Heel Spur Syndrome) These exercises may help you when beginning to rehabilitate your injury. Your symptoms may resolve with or without further involvement from your physician, physical therapist or athletic trainer. While completing these exercises, remember:   Restoring tissue flexibility helps normal motion to return to the joints. This allows healthier, less painful movement and activity.  An effective stretch should be held for at least 30 seconds.  A stretch should never be painful. You should only feel a gentle lengthening or release in the stretched tissue.  RANGE OF MOTION - Toe Extension, Flexion  Sit with your right / left leg crossed over your opposite knee.  Grasp your toes and gently pull them back toward the top of your foot. You should feel a stretch on the bottom of your toes and/or foot.  Hold this stretch for 10 seconds.  Now, gently pull your toes toward the bottom of your foot. You should feel a stretch on the top of your toes and or foot.  Hold this stretch for 10 seconds. Repeat  times. Complete this stretch 3 times per day.   RANGE OF MOTION - Ankle Dorsiflexion, Active Assisted  Remove shoes and sit on a chair that is preferably not on a carpeted surface.  Place right / left foot under knee. Extend your opposite leg for support.  Keeping your heel down, slide your right / left foot back toward the chair until you feel a stretch at your ankle or calf. If you do not feel a stretch, slide your bottom forward to the edge of the chair, while still keeping your heel down.  Hold this stretch for 10 seconds. Repeat 3 times. Complete this stretch 2 times per day.   STRETCH  Gastroc, Standing  Place hands on wall.  Extend right / left leg, keeping the front knee somewhat bent.  Slightly point your toes inward on your back foot.  Keeping your right / left heel on the floor and your knee straight, shift  your weight toward the wall, not allowing your back to arch.  You should feel a gentle stretch in the right / left calf. Hold this position for 10 seconds. Repeat 3 times. Complete this stretch 2 times per day.  STRETCH  Soleus, Standing  Place hands on wall.  Extend right / left leg, keeping the other knee somewhat bent.  Slightly point your toes inward on your back foot.  Keep your right / left heel on the floor, bend your back knee, and slightly shift your weight over the back leg so that you feel a gentle stretch deep in your back calf.  Hold this position for 10 seconds. Repeat 3 times. Complete this stretch 2 times per day.  STRETCH  Gastrocsoleus, Standing  Note: This exercise can place a lot of stress on your foot and ankle. Please complete this exercise only if specifically instructed by your caregiver.   Place the ball of your right / left foot on a step, keeping your other foot firmly on the same step.  Hold on to the wall or a rail for balance.  Slowly lift your other foot, allowing your body weight to press your heel down over the edge of the step.  You should feel a stretch in your right / left calf.  Hold this position   for 10 seconds.  Repeat this exercise with a slight bend in your right / left knee. Repeat 3 times. Complete this stretch 2 times per day.   STRENGTHENING EXERCISES - Plantar Fasciitis (Heel Spur Syndrome)  These exercises may help you when beginning to rehabilitate your injury. They may resolve your symptoms with or without further involvement from your physician, physical therapist or athletic trainer. While completing these exercises, remember:   Muscles can gain both the endurance and the strength needed for everyday activities through controlled exercises.  Complete these exercises as instructed by your physician, physical therapist or athletic trainer. Progress the resistance and repetitions only as guided.  STRENGTH - Towel Curls  Sit in  a chair positioned on a non-carpeted surface.  Place your foot on a towel, keeping your heel on the floor.  Pull the towel toward your heel by only curling your toes. Keep your heel on the floor. Repeat 3 times. Complete this exercise 2 times per day.  STRENGTH - Ankle Inversion  Secure one end of a rubber exercise band/tubing to a fixed object (table, pole). Loop the other end around your foot just before your toes.  Place your fists between your knees. This will focus your strengthening at your ankle.  Slowly, pull your big toe up and in, making sure the band/tubing is positioned to resist the entire motion.  Hold this position for 10 seconds.  Have your muscles resist the band/tubing as it slowly pulls your foot back to the starting position. Repeat 3 times. Complete this exercises 2 times per day.  Document Released: 11/20/2005 Document Revised: 02/12/2012 Document Reviewed: 03/04/2009 [employer] Patient Information 2014 [employer].  

## 2018-10-21 ENCOUNTER — Ambulatory Visit (INDEPENDENT_AMBULATORY_CARE_PROVIDER_SITE_OTHER): Payer: BLUE CROSS/BLUE SHIELD | Admitting: Family Medicine

## 2018-10-21 ENCOUNTER — Encounter: Payer: Self-pay | Admitting: Family Medicine

## 2018-10-21 VITALS — BP 152/80 | HR 59 | Temp 98.0°F | Ht 71.0 in | Wt 202.5 lb

## 2018-10-21 DIAGNOSIS — I10 Essential (primary) hypertension: Secondary | ICD-10-CM

## 2018-10-21 NOTE — Assessment & Plan Note (Signed)
bp is up for about 1 1/2 wk - w/o cause Experiencing more headaches and flushed/tight feeling in face  Disc lifestyle habits ie : fluids/caffeine/sodium/ exercise  Took an extra verapamil sr 120 today (Pulse Rate: (!) 59 plan to increase ziac 5-6.25 mg to 2 pills daily instead of one Continue current dose of verapamil ER Watch pulse  Alert if side effects or dizziness F/u with PCP in 2 wk for visit and labs

## 2018-10-21 NOTE — Patient Instructions (Addendum)
Increase your ziac to 2 pills once daily  Continue the verapamil as ordered  Follow up with Dr Para Marchuncan in about 2 weeks to see how blood pressure is and do labs   Try to limit caffeine - think about weaning it down  Also increase water - (64 oz of water per day is the goal)   Exercise helps Avoiding processed foods with a lot of sodium helps also   If no improvement or headaches get worse -let us know  If you feel dizzy also let us know

## 2018-10-21 NOTE — Progress Notes (Signed)
Subjective:    Patient ID: Martin Cole, male    DOB: [birth year], 59 y.o.   MRN: [id number]  HPI  57 yo pt of Dr Lianne Bushy here for a blood pressure check / with a headache  Started about 1 1/2 weeks ago  Noted a headache (not unusual for him)- but this time felt very tight  Checked his bp at home -- 160s/90s  (high for him)   Wt Readings from Last 3 Encounters:  10/21/18 202 lb 8 oz (91.9 kg)  01/21/18 201 lb 8 oz (91.4 kg)  01/15/17 200 lb (90.7 kg)   28.24 kg/m   BP is elevated BP Readings from Last 3 Encounters:  10/21/18 (!) 162/84  01/21/18 130/70  01/15/17 120/74   Tales bisoprolol -hct  5-6/25 one daily  Also verapamil SR 120 mg (2 pills daily at bedtime) ---took 3 of them today instead of 2  Hx of severe allergic rxn to ace/arb  Pulse Readings from Last 3 Encounters:  10/21/18 (!) 59  01/21/18 62  01/15/17 74   Lab Results  Component Value Date   CREATININE 1.28 01/10/2018   BUN 19 01/10/2018   NA 142 01/10/2018   K 4.3 01/10/2018   CL 104 01/10/2018   CO2 31 01/10/2018   Lab Results  Component Value Date   CHOL 154 01/10/2018   HDL 25.80 (L) 01/10/2018   LDLCALC 96 12/23/2013   LDLDIRECT 79.0 01/10/2018   TRIG 251.0 (H) 01/10/2018   CHOLHDL 6 01/10/2018   No new stress  No change in diet Not enough water  Too much caffeine   Exercise - more than avg due to hunting season   Takes advil otc - 2-3 pills per day   (one to two times weekly)   Patient Active Problem List   Diagnosis Date Noted  . Low back pain 01/23/2018  . Arm numbness 01/23/2018  . Shoulder pain, right 01/23/2018  . Migraine aura without headache   . Lip swelling 01/17/2017  . 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 FH: prostate cancer 01/01/2017  . Advance care planning 01/10/2015  . Sciatica 05/20/2014  . Hallux rigidus 09/15/2013  . Plantar fascial fibromatosis 09/15/2013  . Routine general medical examination at a health care facility 11/06/2012  . Hyperglycemia  11/06/2012  . Anxiety state 12/20/2011  . Hypercholesteremia 10/05/2011  . Essential hypertension 05/24/2007  . GERD 05/24/2007  . ARTHRITIS 05/24/2007   Past Medical History:  Diagnosis Date  . Anxiety   . GERD (gastroesophageal reflux disease)   . Headache(784.0)   . History of ETT 1999   wnl  . Hyperlipidemia   . Hypertension   . Lip swelling    With hives 2017. No clear trigger.  . Migraine aura without headache    Past Surgical History:  Procedure Laterality Date  . ESOPHAGOGASTRODUODENOSCOPY  10/18/98   esoph stricture, esophagitis, duodenitis  . ESOPHAGOGASTRODUODENOSCOPY  12/99   esoph dilation  . KNEE ARTHROSCOPY  1999   left knee  . KNEE ARTHROSCOPY  12/03   right  (Dr. Chaney Malling)  . SKIN CANCER EXCISION  1983  . TOE SURGERY Right    2015  . TONSILLECTOMY  1968   Social History   Tobacco Use  . Smoking status: Former Smoker    Packs/day: 1.00    Years: 20.00    Pack years: 20.00    Last attempt to quit: 10/04/1986    Years since quitting: 32.0  . Smokeless tobacco: Current User  Types: Chew  . Tobacco comment: quit around 1995  Substance Use Topics  . Alcohol use: No  . Drug use: No   Family History  Problem Relation Age of Onset  . Hypertension Mother   . Hyperlipidemia Mother   . Depression Mother   . Dementia Mother   . COPD Father   . Alcohol abuse Father   . Cancer Father        lung  . Cancer Sister        breast, mastectomy 2007  . Heart disease Sister        mitral valve prolapse  . Prostate cancer Maternal Uncle   . Colon cancer Neg Hx    Allergies  Allergen Reactions  . Ace Inhibitors Other (See Comments)    H/o lip swelling- not from medication but would still avoid ACE/ARB meds.   . Angiotensin Receptor Blockers Other (See Comments)    H/o lip swelling- not from medication but would still avoid ACE/ARB meds.   . Lipitor [Atorvastatin]     Aches, but tolerated crestor and simvastatin   Current Outpatient Medications on  File Prior to Visit  Medication Sig Dispense Refill  . aspirin 81 MG tablet Take 81 mg by mouth daily.    . bisoprolol-hydrochlorothiazide (ZIAC) 5-6.25 MG tablet TAKE ONE TABLET BY MOUTH ONCE DAILY (Patient taking differently: 2 tablets. TAKE ONE TABLET BY MOUTH ONCE DAILY) 90 tablet 3  . docusate sodium (COLACE) 100 MG capsule Take 1 capsule (100 mg total) by mouth daily as needed for mild constipation.    . methylPREDNISolone (MEDROL DOSEPAK) 4 MG TBPK tablet 6 day dose pack - take as directed 21 tablet 0  . Multiple Vitamin (MULTIVITAMIN) tablet Take 1 tablet by mouth daily.    Marland Kitchen omeprazole (PRILOSEC OTC) 20 MG tablet Take 1 tablet (20 mg total) by mouth daily. (Patient taking differently: Take 20 mg by mouth as needed. ) 90 tablet 3  . PARoxetine (PAXIL) 20 MG tablet Take 1 tablet (20 mg total) by mouth every morning. 90 tablet 3  . simvastatin (ZOCOR) 40 MG tablet Take 1 tablet (40 mg total) by mouth at bedtime. 90 tablet 3  . verapamil (CALAN-SR) 120 MG CR tablet Take 2 tablets (240 mg total) by mouth at bedtime. 180 tablet 3   No current facility-administered medications on file prior to visit.     Review of Systems  Constitutional: Negative for activity change, appetite change, fatigue, fever and unexpected weight change.  HENT: Negative for congestion, rhinorrhea, sore throat and trouble swallowing.   Eyes: Negative for pain, redness, itching and visual disturbance.  Respiratory: Negative for cough, chest tightness, shortness of breath and wheezing.   Cardiovascular: Negative for chest pain and palpitations.  Gastrointestinal: Negative for abdominal pain, blood in stool, constipation, diarrhea and nausea.  Endocrine: Negative for cold intolerance, heat intolerance, polydipsia and polyuria.  Genitourinary: Negative for difficulty urinating, dysuria, frequency and urgency.  Musculoskeletal: Negative for arthralgias, joint swelling and myalgias.  Skin: Negative for pallor and rash.    Neurological: Positive for headaches. Negative for dizziness, tremors, seizures, syncope, facial asymmetry, speech difficulty, weakness, light-headedness and numbness.  Hematological: Negative for adenopathy. Does not bruise/bleed easily.  Psychiatric/Behavioral: Negative for decreased concentration and dysphoric mood. The patient is not nervous/anxious.        Objective:   Physical Exam  Constitutional: He appears well-developed and well-nourished. No distress.  overwt and well app  HENT:  Head: Normocephalic and atraumatic.  Mouth/Throat: Oropharynx  is clear and moist.  Eyes: Pupils are equal, round, and reactive to light. Conjunctivae and EOM are normal.  Neck: Normal range of motion. Neck supple. No JVD present. Carotid bruit is not present. No thyromegaly present.  Cardiovascular: Normal rate, regular rhythm, normal heart sounds and intact distal pulses. Exam reveals no gallop.  Pulmonary/Chest: Effort normal and breath sounds normal. No respiratory distress. He has no wheezes. He has no rales.  No crackles  Abdominal: Soft. Bowel sounds are normal. He exhibits no distension, no abdominal bruit and no mass. There is no tenderness.  Musculoskeletal: He exhibits no edema.  Lymphadenopathy:    He has no cervical adenopathy.  Neurological: He is alert. He has normal reflexes. He displays no tremor and normal reflexes. No cranial nerve deficit. He exhibits normal muscle tone. Coordination normal.  Skin: Skin is warm and dry. No rash noted.  Psychiatric: He has a normal mood and affect.  Pleasant           Assessment & Plan:   Problem List Items Addressed This Visit      Cardiovascular and Mediastinum   Essential hypertension - Primary    bp is up for about 1 1/2 wk - w/o cause Experiencing more headaches and flushed/tight feeling in face  Disc lifestyle habits ie : fluids/caffeine/sodium/ exercise  Took an extra verapamil sr 120 today (Pulse Rate: (!) 59 plan to increase  ziac 5-6.25 mg to 2 pills daily instead of one Continue current dose of verapamil ER Watch pulse  Alert if side effects or dizziness F/u with PCP in 2 wk for visit and labs

## 2018-10-23 ENCOUNTER — Encounter: Payer: Self-pay | Admitting: Podiatry

## 2018-10-23 ENCOUNTER — Ambulatory Visit (INDEPENDENT_AMBULATORY_CARE_PROVIDER_SITE_OTHER): Payer: BLUE CROSS/BLUE SHIELD | Admitting: Podiatry

## 2018-10-23 DIAGNOSIS — M722 Plantar fascial fibromatosis: Secondary | ICD-10-CM

## 2018-10-23 MED ORDER — CELECOXIB 200 MG PO CAPS: 200.0000 mg | ORAL_CAPSULE | Freq: Every day | ORAL | 3 refills | Status: DC

## 2018-10-23 NOTE — Progress Notes (Signed)
He presents today for follow-up primarily of his heel pain.  He states that the right heel is still hurting and may be only about 10% better that he has not been taking the meloxicam because he does not like to take pain medicine.  The left first metatarsophalangeal joint is doing much better he says a lot more tolerable at this point.  Objective: Vital signs are stable he is alert and oriented x3.  Pulses are palpable.  He has pain on palpation medial calcaneal tubercle of the right heel.  Assessment: Plantar fasciitis recalcitrant right.  Plan: I injected his right heel today with 20 mg Kenalog 5 mg Marcaine point maximal tenderness.  Tolerated procedure well without complications.  Also recommended that he continue the plantar fascial night splint and the plantar fascial brace also recommended we change from meloxicam to Celebrex he will watch this to take it daily and will take it twice a day if necessary.

## 2018-10-29 ENCOUNTER — Telehealth: Payer: Self-pay | Admitting: Podiatry

## 2018-10-29 NOTE — Telephone Encounter (Signed)
Pt was seen last week and was to have prescription sent in to his pharmacy. When pt went to pick up prescription the pharmacy stated they were needing to talk with Dr before they could fill it. Please give pt a call.

## 2018-10-30 NOTE — Telephone Encounter (Signed)
Per Wille CelesteMonnessia with [facility], Celebrex has been approved until 10/30/2019     Pharmacy has been informed of approval

## 2018-11-05 ENCOUNTER — Encounter: Payer: Self-pay | Admitting: Family Medicine

## 2018-11-05 ENCOUNTER — Ambulatory Visit (INDEPENDENT_AMBULATORY_CARE_PROVIDER_SITE_OTHER): Payer: BLUE CROSS/BLUE SHIELD | Admitting: Family Medicine

## 2018-11-05 VITALS — BP 130/78 | HR 65 | Temp 98.4°F | Ht 71.0 in | Wt 201.2 lb

## 2018-11-05 DIAGNOSIS — I10 Essential (primary) hypertension: Secondary | ICD-10-CM | POA: Diagnosis not present

## 2018-11-05 LAB — BASIC METABOLIC PANEL
BUN: 17 mg/dL (ref 6–23)
CALCIUM: 9.8 mg/dL (ref 8.4–10.5)
CHLORIDE: 103 meq/L (ref 96–112)
CO2: 28 meq/L (ref 19–32)
Creatinine, Ser: 1.25 mg/dL (ref 0.40–1.50)
GFR: 62.66 mL/min (ref >60.00)
Glucose, Bld: 94 mg/dL (ref 70–99)
Potassium: 4.2 mEq/L (ref 3.5–5.1)
SODIUM: 138 meq/L (ref 135–145)

## 2018-11-05 MED ORDER — BISOPROLOL-HYDROCHLOROTHIAZIDE 5-6.25 MG PO TABS: 2.0000 | ORAL_TABLET | Freq: Every day | ORAL | 3 refills | Status: DC

## 2018-11-05 NOTE — Progress Notes (Signed)
He increased ziac to 2 tabs a day.  BP is lower.  HA got better in the meantime.  No CP, SOB.  No BLE edema.  Pulse is 65 today.  No known bradycardia.    He is not on celebrex yet.    He has a h/o HA with BP elevation, when that happens.  D/w pt about lower salt diet.  He doesn't add salt.    Meds, vitals, and allergies reviewed.   ROS: Per HPI unless specifically indicated in ROS section   GEN: nad, alert and oriented HEENT: mucous membranes moist NECK: supple w/o LA CV: rrr PULM: ctab, no inc wob ABD: soft, +bs EXT: no edema SKIN: well perfused.

## 2018-11-05 NOTE — Patient Instructions (Signed)
Go to the lab on the way out.  We'll contact you with your lab report. Update me as needed.  Try to cut back on salt a little and see how your numbers run.  If still elevated then update me.   Take care.  Glad to see you.

## 2018-11-06 NOTE — Assessment & Plan Note (Addendum)
Continue as is for now.  Recheck routine labs today given higher dose of hydrochlorothiazide.  Update me as needed.  He can try to cut back on salt a little bit.  Okay for outpatient follow-up.  Improved overall.  Depending on his pulse rate and need for future antihypertensive dose escalation we could separate out his bisoprolol and hydrochlorothiazide dose and/or change his verapamil to amlodipine.  Discussed with patient in general.  I would not make any other changes yet as he does not appear to need them.

## 2018-11-24 IMAGING — DX DG CERVICAL SPINE COMPLETE 4+V
6 series · 6 of 6 positions shown · non-contrast
Comparison: None.

CLINICAL DATA: Neck pain radiating into right shoulder for 4
months. No known injury. Initial encounter.

EXAM:
CERVICAL SPINE - COMPLETE 4+ VIEW

[c-spine lat]
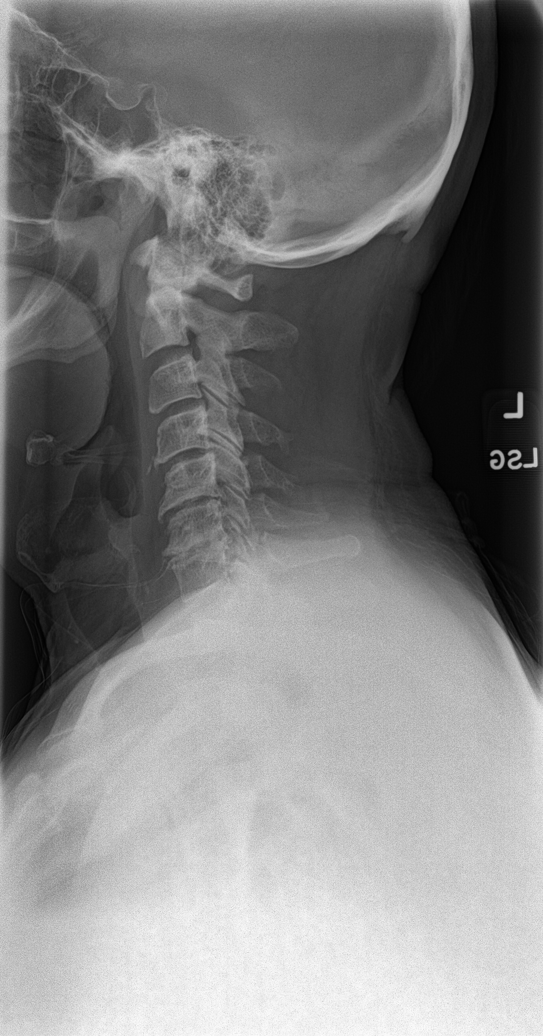

[c-spine obl (1 of 2)]
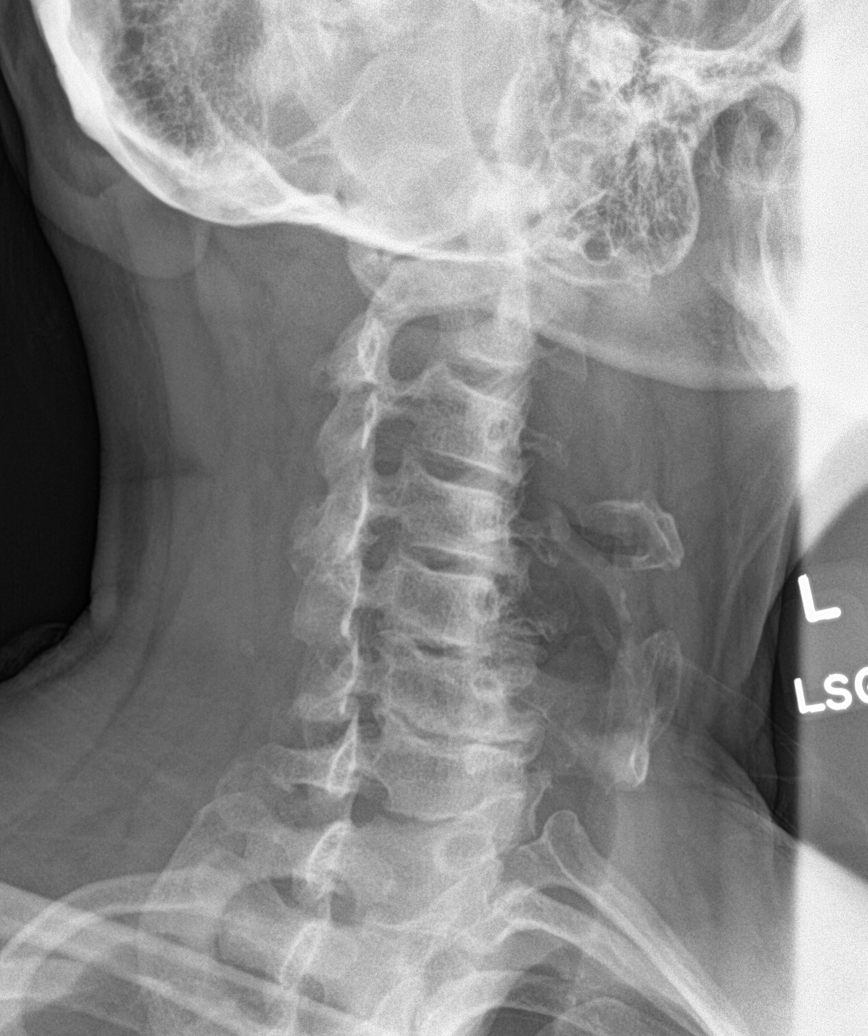

[c-spine obl (2 of 2)]
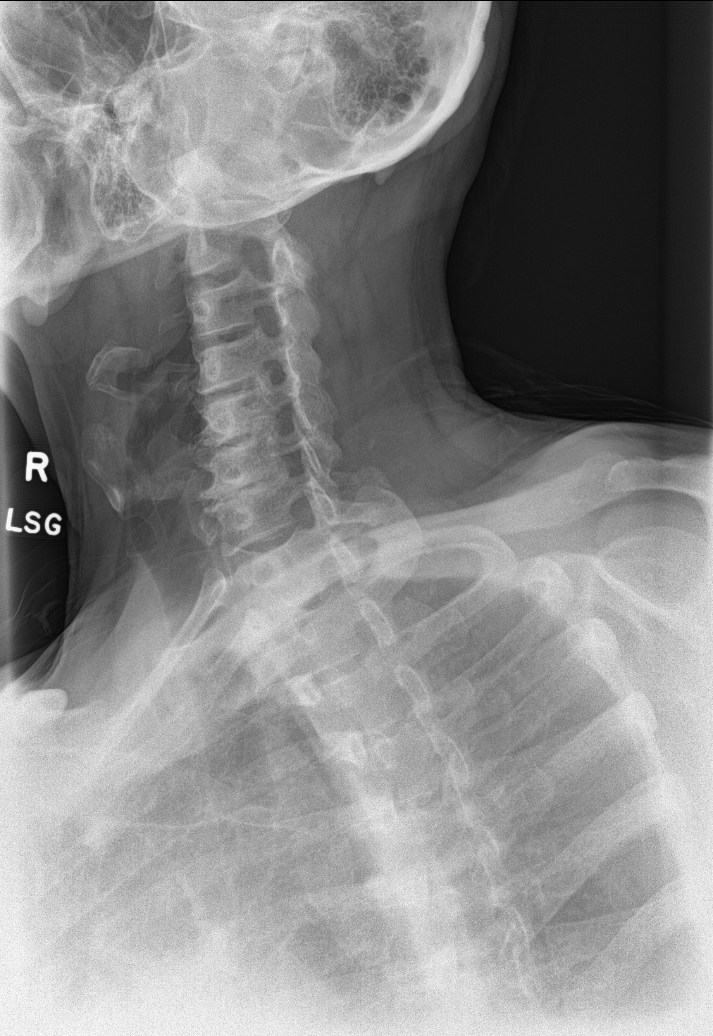

[c-spine ap]
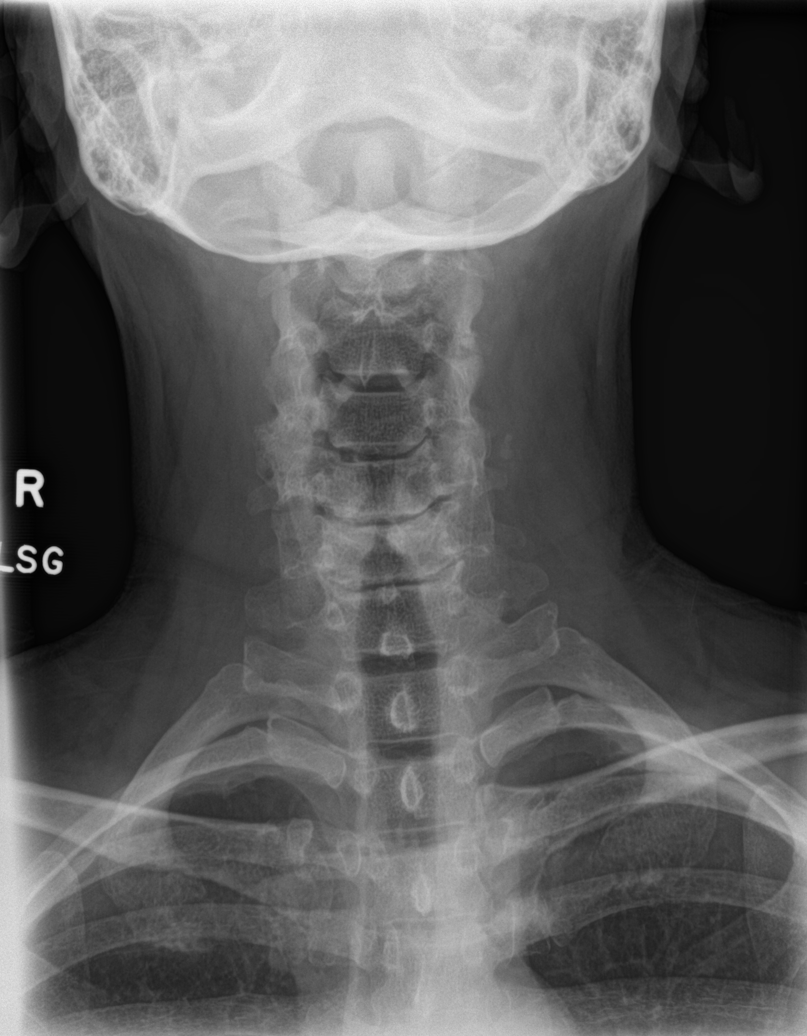

[c-spine open mouth]
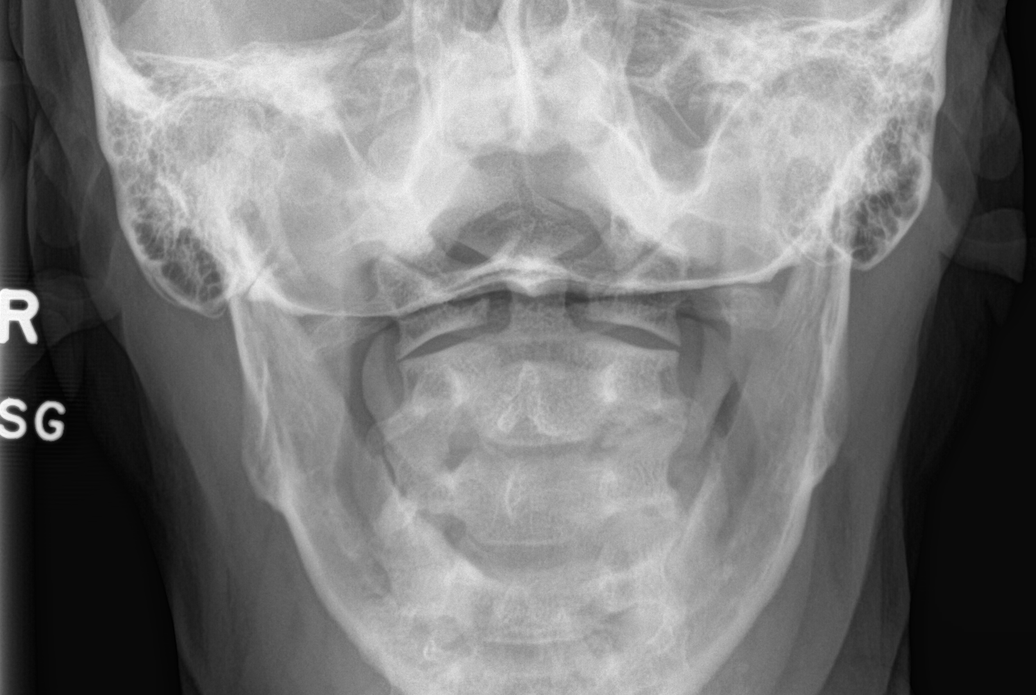

[c-spine swimmers]
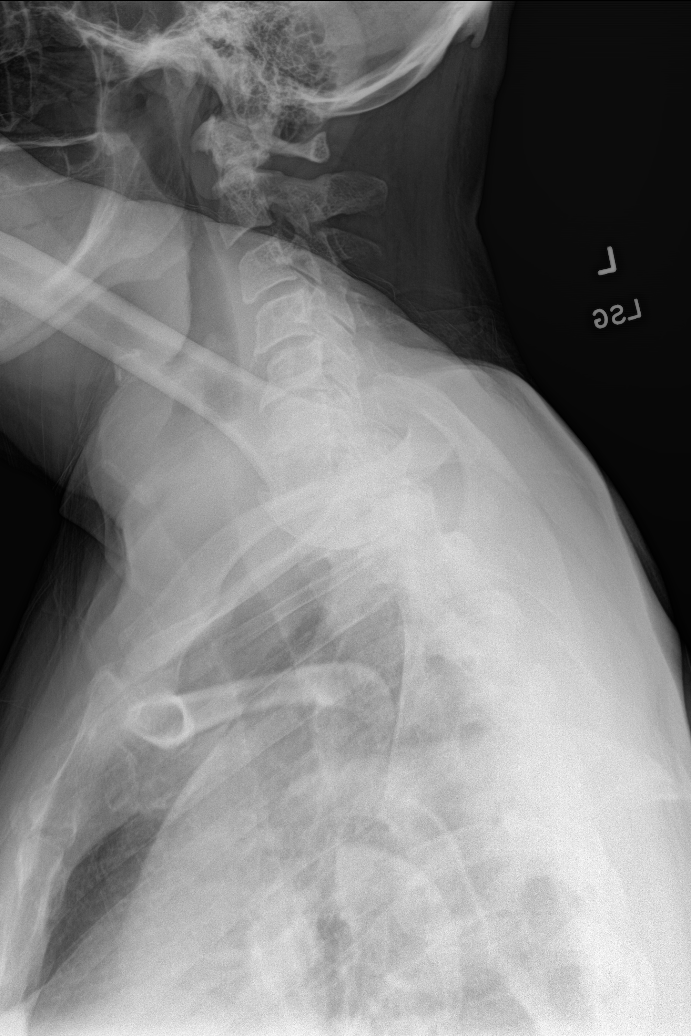

[6 of 6 positions shown; findings below may reference images not displayed]

FINDINGS: There is no evidence of acute fracture or subluxation.

Degenerative disc disease/spondylosis noted, mild at C4-5 and
moderate at C5-6 and C6-7.

Mild bony biforaminal narrowing from C4-C7 noted.

No suspicious focal bony lesions identified.
IMPRESSION: 1. No evidence of acute abnormality
2. Cervical spine degenerative changes from C4-C7 with mild bony
biforaminal narrowing.

## 2018-11-24 IMAGING — DX DG LUMBAR SPINE COMPLETE 4+V
5 series · 5 of 5 positions shown · non-contrast
Comparison: None.

CLINICAL DATA: Low back pain radiating to both legs for 4 months.
No known injury. Initial encounter.

EXAM:
LUMBAR SPINE - COMPLETE 4+ VIEW

[l-spine ap]
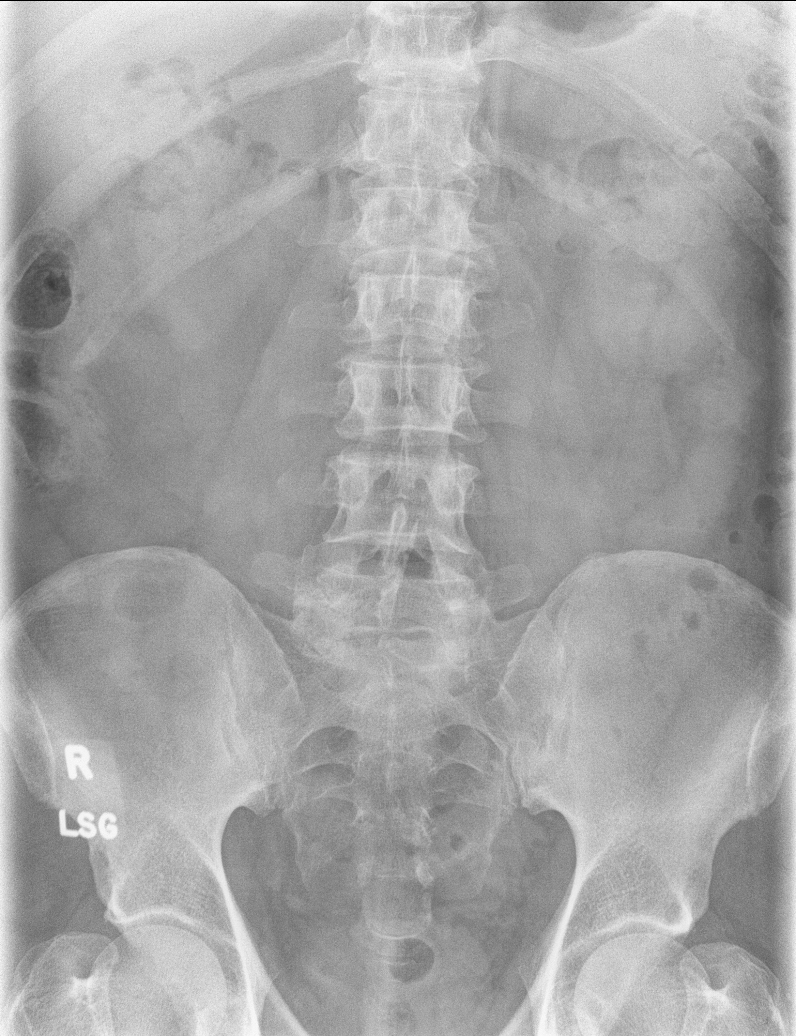

[l-spine obl (1 of 2)]
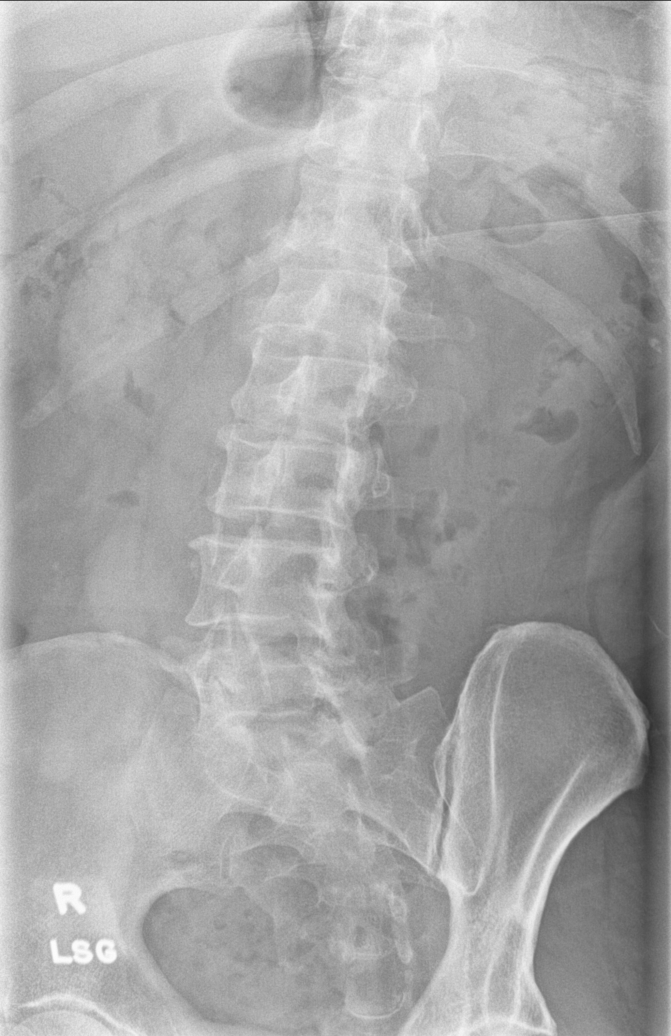

[l-spine obl (2 of 2)]
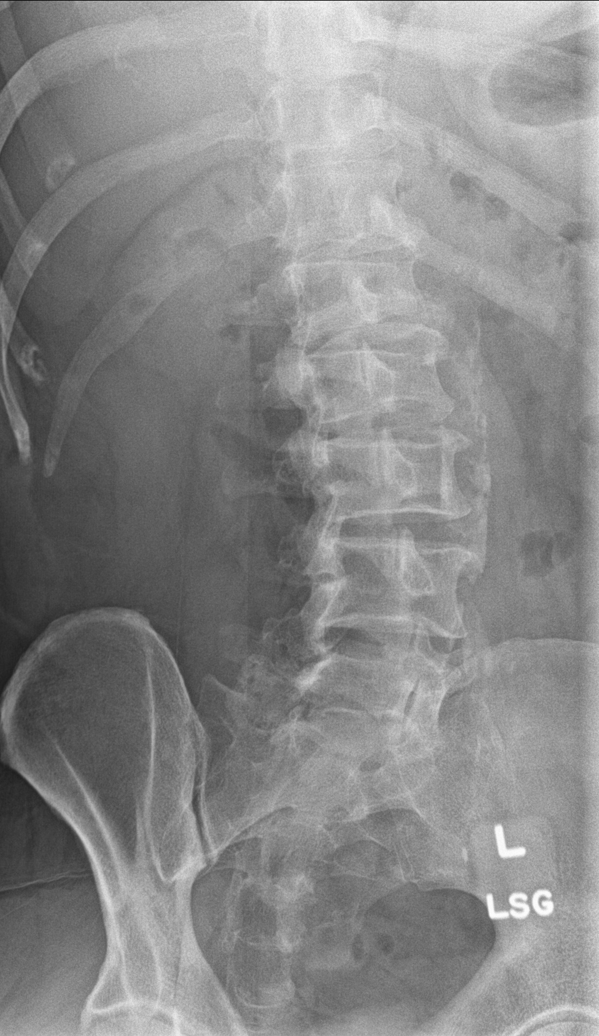

[l-spine lat]
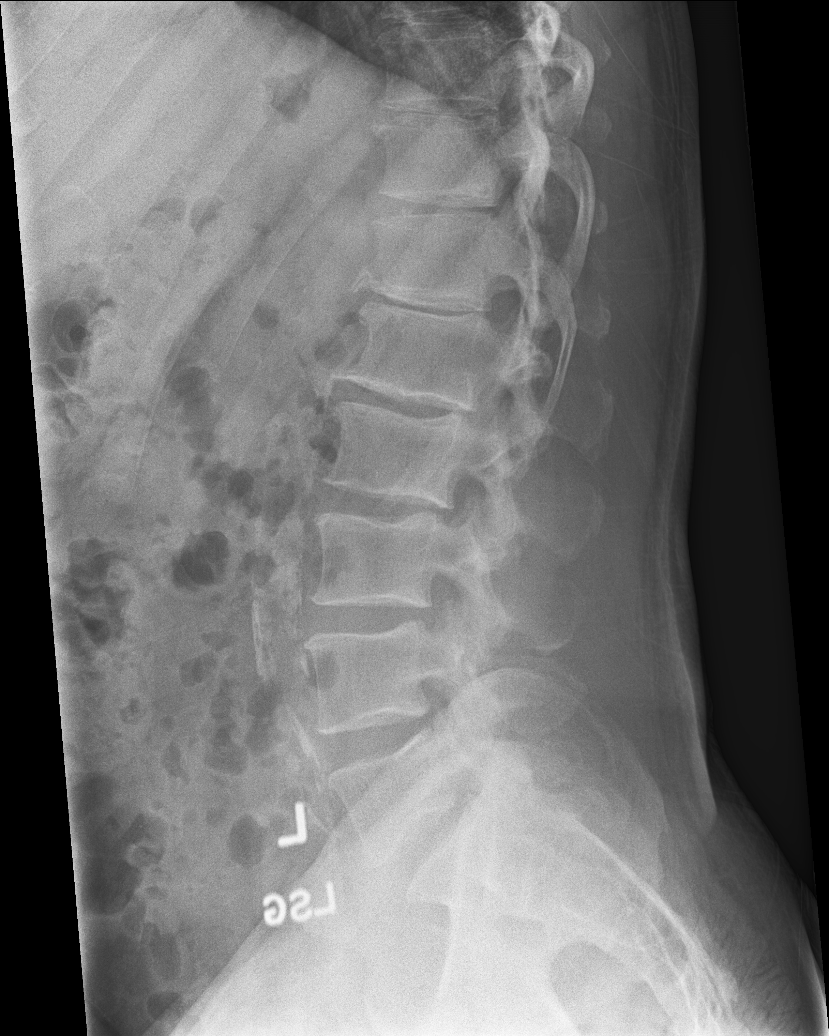

[l-spine l5/s1]
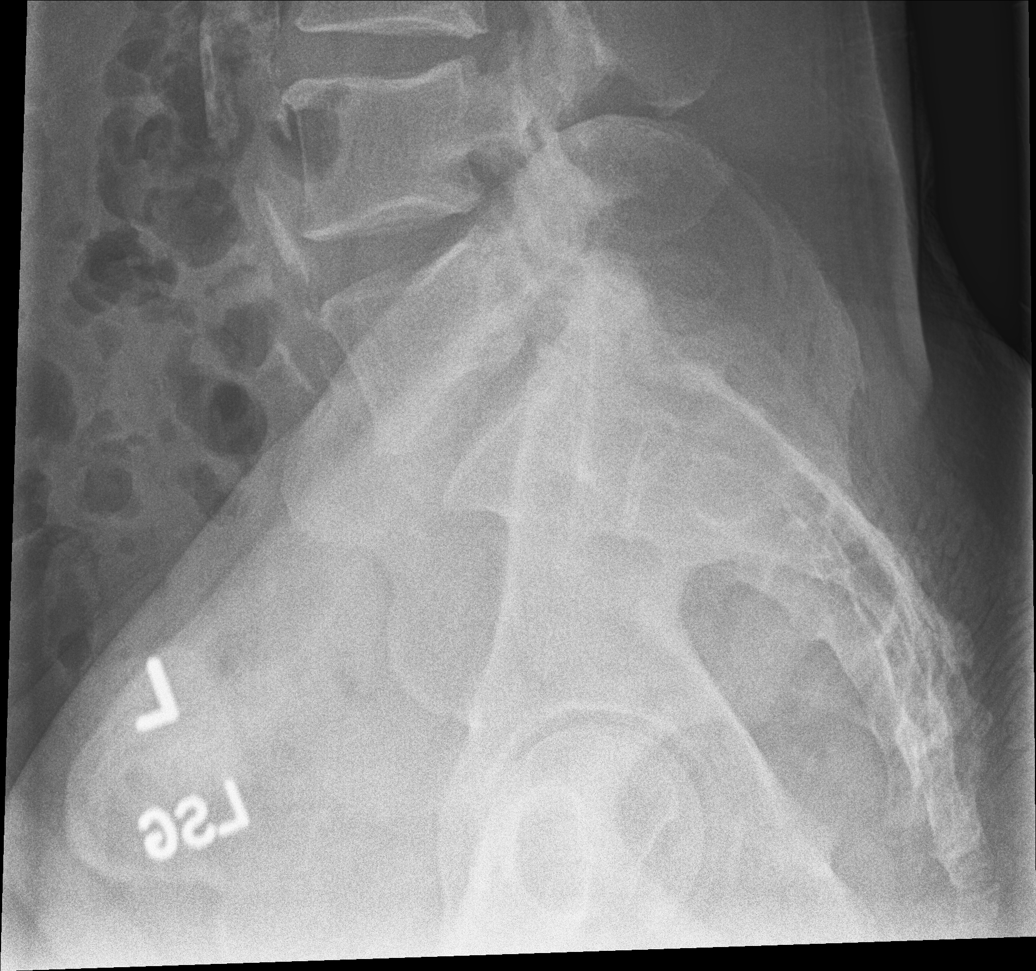

[5 of 5 positions shown; findings below may reference images not displayed]

FINDINGS: Five non rib-bearing lumbar type vertebra are noted in normal
alignment.

There is no evidence of acute fracture or subluxation.

Mild degenerative disc disease from T12-L3 noted.

Mild facet arthropathy in the lower lumbar spine noted.

No focal bony lesions or spondylolysis identified.

Aortic atherosclerotic calcifications identified.
IMPRESSION: 1. Mild degenerative disc disease from T12-L1 and mild facet
arthropathy in the lower lumbar spine.
2.  Aortic Atherosclerosis ([id number].M).

## 2018-12-09 ENCOUNTER — Ambulatory Visit: Payer: BLUE CROSS/BLUE SHIELD | Admitting: Podiatry

## 2019-01-01 ENCOUNTER — Encounter: Payer: Self-pay | Admitting: Podiatry

## 2019-01-01 ENCOUNTER — Ambulatory Visit (INDEPENDENT_AMBULATORY_CARE_PROVIDER_SITE_OTHER): Payer: BLUE CROSS/BLUE SHIELD | Admitting: Podiatry

## 2019-01-01 DIAGNOSIS — M722 Plantar fascial fibromatosis: Secondary | ICD-10-CM | POA: Diagnosis not present

## 2019-01-01 NOTE — Progress Notes (Signed)
He presents today for follow-up of his right heel he says about 80% better he continues to take the Celebrex on a regular basis.  Objective: Vital signs are stable alert and oriented x3.  Pulses are palpable.  Neurologic sensorium is intact.  He still has pain on palpation medial calcaneal tubercle and surgical Cano tubercle of the right heel.  But not nearly as tender as it has been in the past.  Assessment: Resolved plantar fasciitis by 80%.  Plan: Extra Betadine skin prep injected 20 mg Kenalog 5 mg Marcaine point maximal tenderness of the right heel.  Tolerated seizure well without complications follow-up with me in 1 month if necessary.

## 2019-01-17 ENCOUNTER — Other Ambulatory Visit (INDEPENDENT_AMBULATORY_CARE_PROVIDER_SITE_OTHER): Payer: BLUE CROSS/BLUE SHIELD

## 2019-01-17 ENCOUNTER — Other Ambulatory Visit: Payer: Self-pay | Admitting: Family Medicine

## 2019-01-17 DIAGNOSIS — I10 Essential (primary) hypertension: Secondary | ICD-10-CM | POA: Diagnosis not present

## 2019-01-17 DIAGNOSIS — Z125 Encounter for screening for malignant neoplasm of prostate: Secondary | ICD-10-CM

## 2019-01-17 LAB — COMPREHENSIVE METABOLIC PANEL
ALT: 18 U/L (ref 0–53)
AST: 17 U/L (ref 0–37)
Albumin: 4.5 g/dL (ref 3.5–5.2)
Alkaline Phosphatase: 65 U/L (ref 39–117)
BUN: 23 mg/dL (ref 6–23)
CO2: 31 mEq/L (ref 19–32)
Calcium: 9.9 mg/dL (ref 8.4–10.5)
Chloride: 100 mEq/L (ref 96–112)
Creatinine, Ser: 1.39 mg/dL (ref 0.40–1.50)
GFR: 52.12 mL/min — ABNORMAL LOW (ref >60.00)
Glucose, Bld: 88 mg/dL (ref 70–99)
Potassium: 4.6 mEq/L (ref 3.5–5.1)
Sodium: 140 mEq/L (ref 135–145)
Total Bilirubin: 0.7 mg/dL (ref 0.2–1.2)
Total Protein: 6.5 g/dL (ref 6.0–8.3)

## 2019-01-17 LAB — LDL CHOLESTEROL, DIRECT: Direct LDL: 60 mg/dL

## 2019-01-17 LAB — LIPID PANEL
Cholesterol: 140 mg/dL (ref 0–200)
HDL: 25.6 mg/dL — ABNORMAL LOW (ref >39.00)
NonHDL: 114.67
Total CHOL/HDL Ratio: 5
Triglycerides: 292 mg/dL — ABNORMAL HIGH (ref 0.0–149.0)
VLDL: 58.4 mg/dL — ABNORMAL HIGH (ref 0.0–40.0)

## 2019-01-17 LAB — PSA: PSA: 0.92 ng/mL (ref 0.10–4.00)

## 2019-01-23 ENCOUNTER — Encounter: Payer: Self-pay | Admitting: Family Medicine

## 2019-01-23 ENCOUNTER — Ambulatory Visit (INDEPENDENT_AMBULATORY_CARE_PROVIDER_SITE_OTHER): Payer: BLUE CROSS/BLUE SHIELD | Admitting: Family Medicine

## 2019-01-23 VITALS — BP 116/74 | HR 62 | Temp 98.3°F | Ht 71.0 in | Wt 202.2 lb

## 2019-01-23 DIAGNOSIS — Z0000 Encounter for general adult medical examination without abnormal findings: Secondary | ICD-10-CM | POA: Diagnosis not present

## 2019-01-23 DIAGNOSIS — F411 Generalized anxiety disorder: Secondary | ICD-10-CM

## 2019-01-23 DIAGNOSIS — E7800 Pure hypercholesterolemia, unspecified: Secondary | ICD-10-CM

## 2019-01-23 DIAGNOSIS — R1310 Dysphagia, unspecified: Secondary | ICD-10-CM

## 2019-01-23 DIAGNOSIS — G43109 Migraine with aura, not intractable, without status migrainosus: Secondary | ICD-10-CM

## 2019-01-23 DIAGNOSIS — I10 Essential (primary) hypertension: Secondary | ICD-10-CM

## 2019-01-23 DIAGNOSIS — Z7189 Other specified counseling: Secondary | ICD-10-CM

## 2019-01-23 MED ORDER — VERAPAMIL HCL ER 120 MG PO TBCR: 240.0000 mg | EXTENDED_RELEASE_TABLET | Freq: Every day | ORAL | 3 refills | Status: DC

## 2019-01-23 MED ORDER — SIMVASTATIN 40 MG PO TABS: 40.0000 mg | ORAL_TABLET | Freq: Every day | ORAL | 3 refills | Status: DC

## 2019-01-23 MED ORDER — PAROXETINE HCL 20 MG PO TABS: 20.0000 mg | ORAL_TABLET | ORAL | 3 refills | Status: DC

## 2019-01-23 MED ORDER — BISOPROLOL-HYDROCHLOROTHIAZIDE 5-6.25 MG PO TABS: 2.0000 | ORAL_TABLET | Freq: Every day | ORAL | 3 refills | Status: DC

## 2019-01-23 MED ORDER — OMEPRAZOLE MAGNESIUM 20 MG PO TBEC: 20.0000 mg | DELAYED_RELEASE_TABLET | ORAL | Status: AC | PRN

## 2019-01-23 NOTE — Progress Notes (Signed)
CPE- See plan.  Routine anticipatory guidance given to patient.  See health maintenance.  The possibility exists that previously documented standard health maintenance information may have been brought forward from a previous encounter into this note.  If needed, that same information has been updated to reflect the current situation based on today's encounter.    Tetanus 2016 Flu 2019 PNA not due yet. d/w pt.   Shingles out of stock.   PSA wnl, d/w pt.  Colonoscopy 2010 Diet and exercise d/w pt. Encouraged both. Living will d/w pt. Wife would be designated if patient were incapacitated.  HIV and HCV prev neg.  Discussed quitting chewing tobacco, encouraged.    Elevated Cholesterol: Using medications without problems: yes Muscle aches: some aches, but tolerable.  Diet compliance: encouraged.   Exercise: encouraged.   Labs d/w pt.   Hypertension:    Using medication without problems or lightheadedness: yes Chest pain with exertion:no Edema:no Short of breath:no Labs d/w pt.    Mood d/w pt.  Anxiety controlled, no Si/Hi.  No ADE on med.    Migraines are better with BP control.  No ADE on med.    Occ sensation of dysphagia, with some occ food "hanging up" at the lower sternum.  No vomiting, no blood in stool.  He can follow up with GI, d/w pt.    PMH and SH reviewed  Meds, vitals, and allergies reviewed.   ROS: Per HPI.  Unless specifically indicated otherwise in HPI, the patient denies:  General: fever. Eyes: acute vision changes ENT: sore throat Cardiovascular: chest pain Respiratory: SOB GI: vomiting GU: dysuria Musculoskeletal: acute back pain Derm: acute rash Neuro: acute motor dysfunction Psych: worsening mood Endocrine: polydipsia Heme: bleeding Allergy: hayfever  GEN: nad, alert and oriented HEENT: mucous membranes moist NECK: supple w/o LA CV: rrr. PULM: ctab, no inc wob ABD: soft, +bs EXT: no edema SKIN: no acute rash

## 2019-01-23 NOTE — Patient Instructions (Addendum)
Check with your insurance to see if they will cover the shingrix shot. GI should call you in the fall of this year.  Call them in 09/2019 if you don't hear first.   It would be okay to skip a week of simvastatin and see if the aches get a lot better.   Keep working on diet and exercise.  Update me as needed.  Take care.  Glad to see you.

## 2019-01-26 DIAGNOSIS — R1310 Dysphagia, unspecified: Secondary | ICD-10-CM | POA: Insufficient documentation

## 2019-01-26 NOTE — Assessment & Plan Note (Signed)
Controlled.  No change in meds.  Continue work on diet and exercise.  He agrees. 

## 2019-01-26 NOTE — Assessment & Plan Note (Signed)
Tetanus 2016 Flu 2019 PNA not due yet. d/w pt.   Shingles out of stock.   PSA wnl, d/w pt.  Colonoscopy 2010 Diet and exercise d/w pt. Encouraged both. Living will d/w pt. Wife would be designated if patient were incapacitated.  HIV and HCV prev neg.  Discussed quitting chewing tobacco, encouraged.

## 2019-01-26 NOTE — Assessment & Plan Note (Signed)
Mood d/w pt.  Anxiety controlled, no Si/Hi.  No ADE on med.

## 2019-01-26 NOTE — Assessment & Plan Note (Signed)
  Living will d/w pt.  Wife would be designated if patient were incapacitated.   

## 2019-01-26 NOTE — Assessment & Plan Note (Signed)
Triglycerides are elevated but has been higher in the past.  No change in meds.  Continue work on diet and exercise.  He agrees.

## 2019-01-26 NOTE — Assessment & Plan Note (Signed)
Migraines are better with BP control.  No ADE on med.

## 2019-01-26 NOTE — Assessment & Plan Note (Signed)
Occ sensation of dysphagia, with some occ food "hanging up" at the lower sternum.  No vomiting, no blood in stool.  He can follow up with GI, d/w pt. he can update me if he has trouble getting an appointment.

## 2019-03-19 ENCOUNTER — Encounter: Payer: Self-pay | Admitting: Family Medicine

## 2019-04-14 DIAGNOSIS — M25561 Pain in right knee: Secondary | ICD-10-CM | POA: Diagnosis not present

## 2019-04-14 DIAGNOSIS — M25562 Pain in left knee: Secondary | ICD-10-CM | POA: Diagnosis not present

## 2019-04-17 DIAGNOSIS — M170 Bilateral primary osteoarthritis of knee: Secondary | ICD-10-CM | POA: Diagnosis not present

## 2019-04-23 ENCOUNTER — Other Ambulatory Visit: Payer: Self-pay

## 2019-04-23 ENCOUNTER — Ambulatory Visit (INDEPENDENT_AMBULATORY_CARE_PROVIDER_SITE_OTHER): Payer: BLUE CROSS/BLUE SHIELD | Admitting: Podiatry

## 2019-04-23 ENCOUNTER — Encounter: Payer: Self-pay | Admitting: Podiatry

## 2019-04-23 VITALS — Temp 98.7°F

## 2019-04-23 DIAGNOSIS — G5761 Lesion of plantar nerve, right lower limb: Secondary | ICD-10-CM

## 2019-04-23 DIAGNOSIS — M722 Plantar fascial fibromatosis: Secondary | ICD-10-CM | POA: Diagnosis not present

## 2019-04-23 DIAGNOSIS — M778 Other enthesopathies, not elsewhere classified: Secondary | ICD-10-CM

## 2019-04-23 DIAGNOSIS — G5781 Other specified mononeuropathies of right lower limb: Secondary | ICD-10-CM

## 2019-04-23 DIAGNOSIS — M779 Enthesopathy, unspecified: Secondary | ICD-10-CM

## 2019-04-23 NOTE — Progress Notes (Signed)
He presents today for follow-up of bilateral heels.  Says both are really hurting me again today.  Objective: Vital signs are stable he is alert and oriented x3.  He has pain on palpation to the third interdigital space of the right foot.  The right heel does not seem to be bothering him too much however the left heel is really bothering him.  Assessment: Neuroma third interdigital space right foot.  I injected the area today.  I also feel that he has residual plantar fasciitis of the left heel.  Capsulitis second metatarsal phalangeal joint.  Plan: Discussed etiology pathology and surgical therapies cortisone dose 10 mg after sterile Betadine skin prep to the third interdigital space of the right foot.  Left foot does demonstrate plantar fasciitis.  I injected that for him with 20 mg Kenalog 5 mg Marcaine for maximal tenderness.  Around the second metatarsal phalangeal joint.  We will discuss the need for surgical intervention regarding the first metatarsophalangeal joint in the near future.

## 2019-04-24 DIAGNOSIS — M170 Bilateral primary osteoarthritis of knee: Secondary | ICD-10-CM | POA: Diagnosis not present

## 2019-05-01 DIAGNOSIS — M170 Bilateral primary osteoarthritis of knee: Secondary | ICD-10-CM | POA: Diagnosis not present

## 2019-06-04 ENCOUNTER — Ambulatory Visit: Payer: BC Managed Care – PPO | Admitting: Podiatry

## 2019-06-17 ENCOUNTER — Ambulatory Visit (INDEPENDENT_AMBULATORY_CARE_PROVIDER_SITE_OTHER): Payer: BC Managed Care – PPO | Admitting: Family Medicine

## 2019-06-17 ENCOUNTER — Encounter: Payer: Self-pay | Admitting: Family Medicine

## 2019-06-17 ENCOUNTER — Other Ambulatory Visit: Payer: Self-pay

## 2019-06-17 DIAGNOSIS — K644 Residual hemorrhoidal skin tags: Secondary | ICD-10-CM

## 2019-06-17 NOTE — Progress Notes (Signed)
His foot is better after injection.  His knee injections helped.    Lump on buttock. Noted about 4-5 days ago, after BM.  Tingling/burning, sore locally.  Was initially hard but now softening.  At the rectum.  No bleeding.  No FCNAVD.    Meds, vitals, and allergies reviewed.   ROS: Per HPI unless specifically indicated in ROS section   nad Soft nonbleeding L sided ext hemorrhoid at the anal verge.  No gross blood.

## 2019-06-17 NOTE — Patient Instructions (Addendum)
External hemorrhoid that should gradually resolve.   Avoid straining.  It may bleed some but just keep covered if needed.  You can try prep H if needed.   If itching a lot, you can try hydrocortisone cream.  Take care.  Glad to see you.

## 2019-06-18 DIAGNOSIS — K644 Residual hemorrhoidal skin tags: Secondary | ICD-10-CM | POA: Insufficient documentation

## 2019-06-18 NOTE — Assessment & Plan Note (Signed)
Hemorrhoid pathophysiology discussed with patient.  Supportive care at this point.  He can use Preparation H or topical hydrocortisone cream if needed.  Avoid straining.  Would not need thrombectomy at this point.  Update me as needed.  He agrees.

## 2019-06-30 DIAGNOSIS — Z20828 Contact with and (suspected) exposure to other viral communicable diseases: Secondary | ICD-10-CM | POA: Diagnosis not present

## 2019-07-26 ENCOUNTER — Encounter: Payer: Self-pay | Admitting: Gastroenterology

## 2019-08-08 ENCOUNTER — Encounter: Payer: Self-pay | Admitting: Gastroenterology

## 2019-09-08 ENCOUNTER — Other Ambulatory Visit: Payer: Self-pay

## 2019-09-08 DIAGNOSIS — Z20822 Contact with and (suspected) exposure to covid-19: Secondary | ICD-10-CM

## 2019-09-08 DIAGNOSIS — U071 COVID-19: Secondary | ICD-10-CM

## 2019-09-08 DIAGNOSIS — Z20828 Contact with and (suspected) exposure to other viral communicable diseases: Secondary | ICD-10-CM | POA: Diagnosis not present

## 2019-09-08 HISTORY — DX: COVID-19: U07.1

## 2019-09-10 LAB — NOVEL CORONAVIRUS, NAA: SARS-CoV-2, NAA: DETECTED — AB

## 2019-09-12 ENCOUNTER — Telehealth: Payer: Self-pay

## 2019-09-12 NOTE — Telephone Encounter (Signed)
Called patient to follow up. Patient states he is feeling well. He originally was having symptoms of dry cough, muscle aches, nasal congestion and upset stomach but all has pretty much resolved. Symptoms he was having lasted for about 2 days only he said. He states nurse that advised him of the results on 09/10/2019 through [facility] told him she would follow up with him again on Monday.   Dr Damita Dunnings do we need to still call patient next week to follow up at this point?

## 2019-09-12 NOTE — Telephone Encounter (Signed)
If he has other f/u call planned and if he knows to call us as needed, then I think it is okay to defer the f/u through [place] (especially since he feels better).  Thanks.

## 2019-09-12 NOTE — Telephone Encounter (Signed)
Noted. Will follow up with patient next week to make sure someone did call to follow up from [facility]

## 2019-09-16 ENCOUNTER — Telehealth: Payer: Self-pay | Admitting: *Deleted

## 2019-09-16 NOTE — Telephone Encounter (Signed)
Called patient to follow-up with him because of a positive covid test result. . Patient stated that he is doing okay and he felt really bad last Wednesday, Thursday and Friday. Patient stated that he does not have a fever, just a slight cough, no difficulty breathing and no SOB. Patient stated that he has been cleared to go back to work tomorrow. Patient stated that he works from home. Patient stated that he may start back traveling next week.  ER precautions given to patient and he verbalized understanding.

## 2019-09-16 NOTE — Telephone Encounter (Signed)
Noted. Thanks.

## 2019-09-16 NOTE — Telephone Encounter (Signed)
Patient contacted. See phone note.

## 2019-09-16 NOTE — Telephone Encounter (Signed)
Martin Cole,   Can you please reach out to patient today and make sure he is receiving follow up monitoring per Dr. Damita Dunnings (see message string below).  [employer]

## 2019-09-29 ENCOUNTER — Encounter: Payer: BC Managed Care – PPO | Admitting: Gastroenterology

## 2019-09-30 ENCOUNTER — Encounter: Payer: Self-pay | Admitting: Gastroenterology

## 2019-09-30 ENCOUNTER — Ambulatory Visit (INDEPENDENT_AMBULATORY_CARE_PROVIDER_SITE_OTHER): Payer: BC Managed Care – PPO | Admitting: Gastroenterology

## 2019-09-30 ENCOUNTER — Other Ambulatory Visit: Payer: Self-pay

## 2019-09-30 VITALS — BP 120/74 | HR 67 | Temp 98.4°F | Ht 72.0 in | Wt 196.0 lb

## 2019-09-30 DIAGNOSIS — Z1159 Encounter for screening for other viral diseases: Secondary | ICD-10-CM | POA: Diagnosis not present

## 2019-09-30 DIAGNOSIS — R1319 Other dysphagia: Secondary | ICD-10-CM

## 2019-09-30 DIAGNOSIS — R1310 Dysphagia, unspecified: Secondary | ICD-10-CM | POA: Diagnosis not present

## 2019-09-30 DIAGNOSIS — Z1211 Encounter for screening for malignant neoplasm of colon: Secondary | ICD-10-CM | POA: Diagnosis not present

## 2019-09-30 MED ORDER — SUPREP BOWEL PREP KIT 17.5-3.13-1.6 GM/177ML PO SOLN: 1.0000 | ORAL | 0 refills | Status: DC

## 2019-09-30 NOTE — Progress Notes (Signed)
[facility] Consult Note:  History: Martin Cole 09/30/2019  Referring provider: Joaquim Nam, MD  Reason for consult/chief complaint: Dysphagia (solids, onset x months), Heartburn (takes his prilosec prn), and Colon Cancer Screening (Due, former [name] pt)   Subjective  HPI: Covid positive on 09/08/2019.  Had nasal congestion, loss of taste, hip girdle muscle pain and diarrhea, all of which have resolved.  Colonoscopy with Dr. Arlyce Dice September 2010 for heme positive stool.  Notable only for left-sided diverticulosis.  Patient also reports upper endoscopies with dilation perhaps 20 years ago.   This is a very pleasant man self-referred for dysphagia.  Over the last several months has had return of intermittent solid food dysphagia, where it feels stuck in the lower chest.  Many years intermittent heartburn, generally under good control on low-dose Prilosec every 3 to 4 days.  Appetite good, weight stable, denies nausea or vomiting.  For many years his bowel habits have alternated, no change recently.  Had a single episode of anal bleeding a few months back with a firm stool that he attributed to hemorrhoids.   ROS:  Review of Systems  Constitutional: Negative for appetite change and unexpected weight change.  HENT: Negative for mouth sores and voice change.   Eyes: Negative for pain and redness.  Respiratory: Negative for cough and shortness of breath.   Cardiovascular: Negative for chest pain and palpitations.  Genitourinary: Negative for dysuria and hematuria.  Musculoskeletal: Negative for arthralgias and myalgias.  Skin: Negative for pallor and rash.  Neurological: Negative for weakness and headaches.  Hematological: Negative for adenopathy.  Psychiatric/Behavioral:       Mood stable     Past Medical History: Past Medical History:  Diagnosis Date  . Anxiety   . GERD (gastroesophageal reflux disease)   . Headache(784.0)   . History of  ETT 1999   wnl  . Hyperlipidemia   . Hypertension   . Lip swelling    With hives 2017. No clear trigger.  . Migraine aura without headache      Past Surgical History: Past Surgical History:  Procedure Laterality Date  . COLONOSCOPY    . ESOPHAGOGASTRODUODENOSCOPY  10/18/98   esoph stricture, esophagitis, duodenitis  . ESOPHAGOGASTRODUODENOSCOPY  12/99   esoph dilation  . KNEE ARTHROSCOPY  1999   left knee  . KNEE ARTHROSCOPY  12/03   right  (Dr. Chaney Malling)  . SKIN CANCER EXCISION  1983  . TOE SURGERY Right    2015  . TONSILLECTOMY  1968     Family History: Family History  Problem Relation Age of Onset  . Hypertension Mother   . Hyperlipidemia Mother   . Depression Mother   . Dementia Mother   . COPD Father   . Alcohol abuse Father   . Cancer Father        lung  . Cancer Sister        breast, mastectomy 2007  . Heart disease Sister        mitral valve prolapse  . Prostate cancer Maternal Uncle   . Colon cancer Neg Hx   . Esophageal cancer Neg Hx   . Rectal cancer Neg Hx     Social History: Social History   Socioeconomic History  . Marital status: Married    Spouse name: Not on file  . Number of children: 1  . Years of education: Not on file  . Highest education level: Not on file  Occupational History  . Occupation:  Performance assurance specialist    Employer: [employer]  . Financial resource strain: Not on file  . Food insecurity    Worry: Not on file    Inability: Not on file  . Transportation needs    Medical: Not on file    Non-medical: Not on file  Tobacco Use  . Smoking status: Former Smoker    Packs/day: 1.00    Years: 20.00    Pack years: 20.00    Quit date: 10/04/1986    Years since quitting: 33.0  . Smokeless tobacco: Current User    Types: Chew  Substance and Sexual Activity  . Alcohol use: No  . Drug use: No  . Sexual activity: Yes  Lifestyle  . Physical activity    Days per week: Not on file     Minutes per session: Not on file  . Stress: Not on file  Relationships  . Social Herbalist on phone: Not on file    Gets together: Not on file    Attends religious service: Not on file    Active member of club or organization: Not on file    Attends meetings of clubs or organizations: Not on file    Relationship status: Not on file  Other Topics Concern  . 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 Ace Inhibitors Other (See Comments)    H/o lip swelling- not from medication but would still avoid ACE/ARB meds.   . Angiotensin Receptor Blockers Other (See Comments)    H/o lip swelling- not from medication but would still avoid ACE/ARB meds.   . Lipitor [Atorvastatin]     Aches, but tolerated crestor and simvastatin    Outpatient Meds: Current Outpatient Medications  Medication Sig Dispense Refill  . aspirin 81 MG tablet Take 81 mg by mouth daily.    . bisoprolol-hydrochlorothiazide (ZIAC) 5-6.25 MG tablet Take 2 tablets by mouth daily. 180 tablet 3  . docusate sodium (COLACE) 100 MG capsule Take 1 capsule (100 mg total) by mouth daily as needed for mild constipation.    . Multiple Vitamin (MULTIVITAMIN) tablet Take 1 tablet by mouth daily.    Marland Kitchen omeprazole (PRILOSEC OTC) 20 MG tablet Take 1 tablet (20 mg total) by mouth as needed.    Marland Kitchen PARoxetine (PAXIL) 20 MG tablet Take 1 tablet (20 mg total) by mouth every morning. 90 tablet 3  . simvastatin (ZOCOR) 40 MG tablet Take 1 tablet (40 mg total) by mouth at bedtime. 90 tablet 3  . verapamil (CALAN-SR) 120 MG CR tablet Take 2 tablets (240 mg total) by mouth daily. 180 tablet 3   No current facility-administered medications for this visit.       ___________________________________________________________________ Objective   Exam:  BP 120/74   Pulse 67   Temp 98.4 F (36.9 C)   Ht 6' (1.829 m)   Wt 196 lb  (88.9 kg)   BMI 26.58 kg/m    General: Well-appearing, normal vocal quality  Eyes: sclera anicteric, no redness  ENT: oral mucosa moist without lesions, no cervical or supraclavicular lymphadenopathy  CV: RRR without murmur, S1/S2, no JVD, no peripheral edema  Resp: clear to auscultation bilaterally, normal RR and effort noted  GI: soft, no tenderness, with active bowel sounds. No guarding or palpable organomegaly noted.  Skin; warm and dry,  no rash or jaundice noted  Neuro: awake, alert and oriented x 3. Normal gross motor function and fluent speech  No recent data other than Covid test  Assessment: Encounter Diagnoses  Name Primary?  . Esophageal dysphagia Yes  . Special screening for malignant neoplasms, colon     Probable recurrence of esophageal stricture, sounds like that was present many years ago requiring endoscopic dilation. Underlying GERD, symptoms seem under generally good control on low-dose PPI. Former smoker, longtime GERD symptoms, increased risk Barrett's esophagus. Average risk colorectal cancer  Plan:  EGD with probable dilation and colonoscopy.  He was agreeable after discussion of procedure and risks.  The benefits and risks of the planned procedure were described in detail with the patient or (when appropriate) their health care proxy.  Risks were outlined as including, but not limited to, bleeding, infection, perforation, adverse medication reaction leading to cardiac or pulmonary decompensation, pancreatitis (if ERCP).  The limitation of incomplete mucosal visualization was also discussed.  No guarantees or warranties were given.   Charlie PitterHenry L Danis III

## 2019-09-30 NOTE — Patient Instructions (Signed)
If you are age 60 or older, your body mass index should be between 23-30. Your Body mass index is 26.58 kg/m. If this is out of the aforementioned range listed, please consider follow up with your Primary Care Provider.  If you are age 89 or younger, your body mass index should be between 19-25. Your Body mass index is 26.58 kg/m. If this is out of the aformentioned range listed, please consider follow up with your Primary Care Provider.    You have been scheduled for an endoscopy and colonoscopy. Please follow the written instructions given to you at your visit today. Please pick up your prep supplies at the pharmacy within the next 1-3 days. If you use inhalers (even only as needed), please bring them with you on the day of your procedure.  Thank you for choosing me and [facility].  Dr.Danis

## 2019-10-29 ENCOUNTER — Ambulatory Visit: Payer: BC Managed Care – PPO

## 2019-10-29 ENCOUNTER — Other Ambulatory Visit: Payer: Self-pay | Admitting: Gastroenterology

## 2019-10-29 ENCOUNTER — Encounter: Payer: Self-pay | Admitting: Gastroenterology

## 2019-10-29 DIAGNOSIS — Z1159 Encounter for screening for other viral diseases: Secondary | ICD-10-CM

## 2019-10-31 LAB — SARS CORONAVIRUS 2 (TAT 6-24 HRS): SARS Coronavirus 2: NEGATIVE

## 2019-11-04 ENCOUNTER — Ambulatory Visit (AMBULATORY_SURGERY_CENTER): Payer: BC Managed Care – PPO | Admitting: Gastroenterology

## 2019-11-04 ENCOUNTER — Other Ambulatory Visit: Payer: Self-pay

## 2019-11-04 ENCOUNTER — Encounter: Payer: Self-pay | Admitting: Gastroenterology

## 2019-11-04 VITALS — BP 93/59 | HR 51 | Temp 98.5°F | Resp 19 | Ht 72.0 in | Wt 196.0 lb

## 2019-11-04 DIAGNOSIS — R1310 Dysphagia, unspecified: Secondary | ICD-10-CM

## 2019-11-04 DIAGNOSIS — K222 Esophageal obstruction: Secondary | ICD-10-CM

## 2019-11-04 DIAGNOSIS — K2100 Gastro-esophageal reflux disease with esophagitis, without bleeding: Secondary | ICD-10-CM

## 2019-11-04 DIAGNOSIS — Z1211 Encounter for screening for malignant neoplasm of colon: Secondary | ICD-10-CM | POA: Diagnosis not present

## 2019-11-04 DIAGNOSIS — R1319 Other dysphagia: Secondary | ICD-10-CM

## 2019-11-04 MED ORDER — SODIUM CHLORIDE 0.9 % IV SOLN: 500.0000 mL | Freq: Once | INTRAVENOUS | Status: DC

## 2019-11-04 NOTE — Op Note (Signed)
[facility] Patient Name: Martin Cole Procedure Date: 11/04/2019 1:48 PM MRN: [id number] Endoscopist: Mallie Mussel L. Loletha Carrow , MD Age: 58 Referring MD:  Date of Birth: 02/23/1961 Gender: Male Account #: [id number] Procedure:                Upper GI endoscopy Indications:              Esophageal dysphagia Medicines:                Monitored Anesthesia Care Procedure:                Pre-Anesthesia Assessment:                           - Prior to the procedure, a History and Physical                            was performed, and patient medications and                            allergies were reviewed. The patient's tolerance of                            previous anesthesia was also reviewed. The risks                            and benefits of the procedure and the sedation                            options and risks were discussed with the patient.                            All questions were answered, and informed consent                            was obtained. Prior Anticoagulants: The patient has                            taken no previous anticoagulant or antiplatelet                            agents except for aspirin. ASA Grade Assessment: II                            - A patient with mild systemic disease. After                            reviewing the risks and benefits, the patient was                            deemed in satisfactory condition to undergo the                            procedure.  After obtaining informed consent, the endoscope was                            passed under direct vision. Throughout the                            procedure, the patient's blood pressure, pulse, and                            oxygen saturations were monitored continuously. The                            Endoscope was introduced through the mouth, and                            advanced to the second part of duodenum. The upper            GI endoscopy was accomplished without difficulty.                            The patient tolerated the procedure well. Scope In: Scope Out: Findings:                 The larynx was normal.                           LA Grade A (one or more mucosal breaks less than 5                            mm, not extending between tops of 2 mucosal folds)                            esophagitis with no bleeding was found at the                            gastroesophageal junction.                           One benign-appearing, intrinsic moderate stenosis                            was found at the gastroesophageal junction. This                            stenosis measured 1.2 to 1.4 cm (inner diameter) x                            less than one cm (in length). The stenosis was                            traversed. The scope was withdrawn. Dilation was                            performed with a Maloney dilator with moderate  resistance at 50 Fr. The dilation site was examined                            following endoscope reinsertion and showed no                            change. This was biopsied with a cold forceps for                            disruption of mucosal stricture - no tissue sent                            for pathology.                           The exam of the esophagus was otherwise normal.                           The stomach was normal.                           The cardia and gastric fundus were normal on                            retroflexion.                           The examined duodenum was normal. Complications:            No immediate complications. Estimated Blood Loss:     Estimated blood loss was minimal. Impression:               - Normal larynx.                           - LA Grade A reflux esophagitis with no bleeding.                           - Benign-appearing esophageal stenosis. Dilated.                             Biopsied.                           - Normal stomach.                           - Normal examined duodenum. Recommendation:           - Patient has a contact number available for                            emergencies. The signs and symptoms of potential                            delayed complications were discussed with the  patient. Return to normal activities tomorrow.                            Written discharge instructions were provided to the                            patient.                           - Resume previous diet.                           - Continue present medications.                           - See the other procedure note for documentation of                            additional recommendations. Henry L. Myrtie Neither, MD 11/04/2019 2:26:59 PM This report has been signed electronically.

## 2019-11-04 NOTE — Patient Instructions (Signed)
YOU HAD AN ENDOSCOPIC PROCEDURE TODAY AT THE [facility]:   Refer to the procedure report that was given to you for any specific questions about what was found during the examination.  If the procedure report does not answer your questions, please call your gastroenterologist to clarify.  If you requested that your care partner not be given the details of your procedure findings, then the procedure report has been included in a sealed envelope for you to review at your convenience later.  YOU SHOULD EXPECT: Some feelings of bloating in the abdomen. Passage of more gas than usual.  Walking can help get rid of the air that was put into your GI tract during the procedure and reduce the bloating. If you had a lower endoscopy (such as a colonoscopy or flexible sigmoidoscopy) you may notice spotting of blood in your stool or on the toilet paper. If you underwent a bowel prep for your procedure, you may not have a normal bowel movement for a few days.  Please Note:  You might notice some irritation and congestion in your nose or some drainage.  This is from the oxygen used during your procedure.  There is no need for concern and it should clear up in a day or so.  SYMPTOMS TO REPORT IMMEDIATELY:   Following lower endoscopy (colonoscopy or flexible sigmoidoscopy):  Excessive amounts of blood in the stool  Significant tenderness or worsening of abdominal pains  Swelling of the abdomen that is new, acute  Fever of 100F or higher   Following upper endoscopy (EGD)  Vomiting of blood or coffee ground material  New chest pain or pain under the shoulder blades  Painful or persistently difficult swallowing  New shortness of breath  Fever of 100F or higher  Black, tarry-looking stools  For urgent or emergent issues, a gastroenterologist can be reached at any hour by calling [phone].   DIET:  We do recommend a small meal at first, but then you may proceed to your regular diet.  Drink  plenty of fluids but you should avoid alcoholic beverages for 24 hours.  ACTIVITY:  You should plan to take it easy for the rest of today and you should NOT DRIVE or use heavy machinery until tomorrow (because of the sedation medicines used during the test).    FOLLOW UP: Our staff will call the number listed on your records 48-72 hours following your procedure to check on you and address any questions or concerns that you may have regarding the information given to you following your procedure. If we do not reach you, we will leave a message.  We will attempt to reach you two times.  During this call, we will ask if you have developed any symptoms of COVID 19. If you develop any symptoms (ie: fever, flu-like symptoms, shortness of breath, cough etc.) before then, please call [phone].  If you test positive for Covid 19 in the 2 weeks post procedure, please call and report this information to us.    If any biopsies were taken you will be contacted by phone or by letter within the next 1-3 weeks.  Please call us at [phone] if you have not heard about the biopsies in 3 weeks.    SIGNATURES/CONFIDENTIALITY: You and/or your care partner have signed paperwork which will be entered into your electronic medical record.  These signatures attest to the fact that that the information above on your After Visit Summary has been reviewed and is   understood.  Full responsibility of the confidentiality of this discharge information lies with you and/or your care-partner.    Handouts were given to you on diverticulosis, esophagitis and esophageal stenosis. You may resume your current medications today. Repeat screening colonoscopy in 10 years. Please call if any questions or concerns.

## 2019-11-04 NOTE — Progress Notes (Signed)
Per Dr. Loletha Carrow no need for pt to be on a esophageal dilatation diet today, even though his esophagus was dilatated.  No problems note in the recovery room. maw

## 2019-11-04 NOTE — Progress Notes (Signed)
To PACU, VSS. Report to [email or web address] 

## 2019-11-04 NOTE — Progress Notes (Signed)
Pt's states no medical or surgical changes since previsit or office visit.  CW vitals, RM IV and JB temp.

## 2019-11-04 NOTE — Op Note (Signed)
[facility] Patient Name: Martin Cole Procedure Date: 11/04/2019 1:48 PM MRN: [id number] Endoscopist: Sherilyn Cooter L. Myrtie Neither , MD Age: 58 Referring MD:  Date of Birth: [birth year] Gender: Male Account #: [id number] Procedure:                Colonoscopy Indications:              Screening for colorectal malignant neoplasm (no                            polyps on last colonoscopy 08/2009) Medicines:                Monitored Anesthesia Care Procedure:                Pre-Anesthesia Assessment:                           - Prior to the procedure, a History and Physical                            was performed, and patient medications and                            allergies were reviewed. The patient's tolerance of                            previous anesthesia was also reviewed. The risks                            and benefits of the procedure and the sedation                            options and risks were discussed with the patient.                            All questions were answered, and informed consent                            was obtained. Prior Anticoagulants: The patient has                            taken no previous anticoagulant or antiplatelet                            agents except for aspirin. ASA Grade Assessment: II                            - A patient with mild systemic disease. After                            reviewing the risks and benefits, the patient was                            deemed in satisfactory condition to undergo the  procedure.                           After obtaining informed consent, the colonoscope                            was passed under direct vision. Throughout the                            procedure, the patient's blood pressure, pulse, and                            oxygen saturations were monitored continuously. The                            Colonoscope was introduced through the anus and                             advanced to the the cecum, identified by                            appendiceal orifice and ileocecal valve. The                            colonoscopy was performed without difficulty. The                            patient tolerated the procedure well. The quality                            of the bowel preparation was good. The ileocecal                            valve, appendiceal orifice, and rectum were                            photographed. Scope In: 2:07:05 PM Scope Out: 2:19:31 PM Scope Withdrawal Time: 0 hours 7 minutes 16 seconds  Total Procedure Duration: 0 hours 12 minutes 26 seconds  Findings:                 The perianal and digital rectal examinations were                            normal.                           Multiple diverticula were found in the left colon.                           The exam was otherwise without abnormality on                            direct and retroflexion views. Complications:            No immediate complications. Estimated Blood Loss:     Estimated  blood loss: none. Impression:               - Diverticulosis in the left colon.                           - The examination was otherwise normal on direct                            and retroflexion views.                           - No specimens collected. Recommendation:           - Patient has a contact number available for                            emergencies. The signs and symptoms of potential                            delayed complications were discussed with the                            patient. Return to normal activities tomorrow.                            Written discharge instructions were provided to the                            patient.                           - Resume previous diet.                           - Continue present medications.                           - Repeat colonoscopy in 10 years for screening                             purposes. Henry L. Myrtie Neitheranis, MD 11/04/2019 2:29:10 PM This report has been signed electronically.

## 2019-11-04 NOTE — Progress Notes (Signed)
Called to room to assist during endoscopic procedure.  Patient ID and intended procedure confirmed with present staff. Received instructions for my participation in the procedure from the performing physician.  

## 2019-11-06 ENCOUNTER — Telehealth: Payer: Self-pay

## 2019-11-06 NOTE — Telephone Encounter (Signed)
  Follow up Call-  Call back number 11/04/2019  Post procedure Call Back phone  # [phone]  Permission to leave phone message Yes  Some recent data might be hidden     Patient questions:  Do you have a fever, pain , or abdominal swelling? No. Pain Score  0 *  Have you tolerated food without any problems? Yes.    Have you been able to return to your normal activities? Yes.    Do you have any questions about your discharge instructions: Diet   No. Medications  No. Follow up visit  No.  Do you have questions or concerns about your Care? No.  Actions: * If pain score is 4 or above: No action needed, pain <4.  1. Have you developed a fever since your procedure? No  2.   Have you had an respiratory symptoms (SOB or cough) since your procedure? No 3.   Have you tested positive for COVID 19 since your procedure No  4.   Have you had any family members/close contacts diagnosed with the COVID 19 since your procedure?  No   If yes to any of these questions please route to Joylene John, RN and Alphonsa Gin, RN.

## 2019-11-23 ENCOUNTER — Other Ambulatory Visit: Payer: Self-pay | Admitting: Family Medicine

## 2019-11-23 DIAGNOSIS — Z125 Encounter for screening for malignant neoplasm of prostate: Secondary | ICD-10-CM

## 2019-11-23 DIAGNOSIS — I10 Essential (primary) hypertension: Secondary | ICD-10-CM

## 2019-12-04 ENCOUNTER — Other Ambulatory Visit: Payer: BC Managed Care – PPO

## 2019-12-08 ENCOUNTER — Encounter: Payer: BC Managed Care – PPO | Admitting: Family Medicine

## 2020-01-12 DIAGNOSIS — H25043 Posterior subcapsular polar age-related cataract, bilateral: Secondary | ICD-10-CM | POA: Diagnosis not present

## 2020-01-12 DIAGNOSIS — H2513 Age-related nuclear cataract, bilateral: Secondary | ICD-10-CM | POA: Diagnosis not present

## 2020-01-12 DIAGNOSIS — H2512 Age-related nuclear cataract, left eye: Secondary | ICD-10-CM | POA: Diagnosis not present

## 2020-01-12 DIAGNOSIS — H25013 Cortical age-related cataract, bilateral: Secondary | ICD-10-CM | POA: Diagnosis not present

## 2020-01-12 DIAGNOSIS — I10 Essential (primary) hypertension: Secondary | ICD-10-CM | POA: Diagnosis not present

## 2020-01-30 ENCOUNTER — Other Ambulatory Visit: Payer: Self-pay

## 2020-01-30 ENCOUNTER — Other Ambulatory Visit (INDEPENDENT_AMBULATORY_CARE_PROVIDER_SITE_OTHER): Payer: BC Managed Care – PPO

## 2020-01-30 DIAGNOSIS — Z125 Encounter for screening for malignant neoplasm of prostate: Secondary | ICD-10-CM

## 2020-01-30 DIAGNOSIS — I10 Essential (primary) hypertension: Secondary | ICD-10-CM

## 2020-01-30 LAB — COMPREHENSIVE METABOLIC PANEL
ALT: 19 U/L (ref 0–53)
AST: 15 U/L (ref 0–37)
Albumin: 4.2 g/dL (ref 3.5–5.2)
Alkaline Phosphatase: 65 U/L (ref 39–117)
BUN: 18 mg/dL (ref 6–23)
CO2: 31 mEq/L (ref 19–32)
Calcium: 9.8 mg/dL (ref 8.4–10.5)
Chloride: 103 mEq/L (ref 96–112)
Creatinine, Ser: 1.31 mg/dL (ref 0.40–1.50)
GFR: 55.62 mL/min — ABNORMAL LOW (ref >60.00)
Glucose, Bld: 98 mg/dL (ref 70–99)
Potassium: 4.1 mEq/L (ref 3.5–5.1)
Sodium: 140 mEq/L (ref 135–145)
Total Bilirubin: 0.5 mg/dL (ref 0.2–1.2)
Total Protein: 6.6 g/dL (ref 6.0–8.3)

## 2020-01-30 LAB — LIPID PANEL
Cholesterol: 156 mg/dL (ref 0–200)
HDL: 25.5 mg/dL — ABNORMAL LOW (ref >39.00)
NonHDL: 130.32
Total CHOL/HDL Ratio: 6
Triglycerides: 251 mg/dL — ABNORMAL HIGH (ref 0.0–149.0)
VLDL: 50.2 mg/dL — ABNORMAL HIGH (ref 0.0–40.0)

## 2020-01-30 LAB — PSA: PSA: 0.82 ng/mL (ref 0.10–4.00)

## 2020-01-30 LAB — LDL CHOLESTEROL, DIRECT: Direct LDL: 84 mg/dL

## 2020-02-06 ENCOUNTER — Encounter: Payer: Self-pay | Admitting: Family Medicine

## 2020-02-06 ENCOUNTER — Other Ambulatory Visit: Payer: Self-pay

## 2020-02-06 ENCOUNTER — Ambulatory Visit (INDEPENDENT_AMBULATORY_CARE_PROVIDER_SITE_OTHER): Payer: BC Managed Care – PPO | Admitting: Family Medicine

## 2020-02-06 VITALS — BP 112/74 | HR 65 | Temp 97.1°F | Ht 72.0 in | Wt 201.1 lb

## 2020-02-06 DIAGNOSIS — Z7189 Other specified counseling: Secondary | ICD-10-CM

## 2020-02-06 DIAGNOSIS — R202 Paresthesia of skin: Secondary | ICD-10-CM

## 2020-02-06 DIAGNOSIS — Z0000 Encounter for general adult medical examination without abnormal findings: Secondary | ICD-10-CM

## 2020-02-06 DIAGNOSIS — F411 Generalized anxiety disorder: Secondary | ICD-10-CM

## 2020-02-06 DIAGNOSIS — E7800 Pure hypercholesterolemia, unspecified: Secondary | ICD-10-CM

## 2020-02-06 DIAGNOSIS — I10 Essential (primary) hypertension: Secondary | ICD-10-CM

## 2020-02-06 MED ORDER — PAROXETINE HCL 20 MG PO TABS: 20.0000 mg | ORAL_TABLET | ORAL | 3 refills | Status: DC

## 2020-02-06 MED ORDER — EPINEPHRINE 0.3 MG/0.3ML IJ SOAJ: 0.3000 mg | INTRAMUSCULAR | 1 refills | Status: DC | PRN

## 2020-02-06 MED ORDER — BISOPROLOL-HYDROCHLOROTHIAZIDE 5-6.25 MG PO TABS: 2.0000 | ORAL_TABLET | Freq: Every day | ORAL | 3 refills | Status: DC

## 2020-02-06 MED ORDER — VERAPAMIL HCL ER 120 MG PO TBCR: 240.0000 mg | EXTENDED_RELEASE_TABLET | Freq: Every day | ORAL | 3 refills | Status: DC

## 2020-02-06 MED ORDER — SIMVASTATIN 40 MG PO TABS: 40.0000 mg | ORAL_TABLET | Freq: Every day | ORAL | 3 refills | Status: DC

## 2020-02-06 NOTE — Progress Notes (Signed)
This visit occurred during the SARS-CoV-2 public health emergency.  Safety protocols were in place, including screening questions prior to the visit, additional usage of staff PPE, and extensive cleaning of exam room while observing appropriate contact time as indicated for disinfecting solutions.  CPE- See plan.  Routine anticipatory guidance given to patient.  See health maintenance.  The possibility exists that previously documented standard health maintenance information may have been brought forward from a previous encounter into this note.  If needed, that same information has been updated to reflect the current situation based on today's encounter.    Tetanus 2016 Flu 2020 PNA not due yet. d/w pt.  Shingles d/w pt.   covid vaccine d/w pt.  PSA wnl, d/w pt.  Colonoscopy 2020 Diet and exercise d/w pt. Encouraged both. Living will d/w pt. Wife would be designated if patient were incapacitated.  HIV and HCV prev neg.  Discussed quitting chewing tobacco, encouraged.  He has nocturnal numbness w/o weakness in the hands, d/w pt about trial of bracing.  See exam below.  He has seen the eye clinic about cataracts.  See AVS.  He may need a referral.    Hypertension:    Using medication without problems or lightheadedness:  yes Chest pain with exertion:no Edema:no Short of breath:no  Mood d/w pt.  No ADE on med.  Anxiety controlled, no Si/Hi.   Elevated Cholesterol: Using medications without problems: yes Muscle aches: some aches attributed "to being in my 60s."   Diet compliance: yes Exercise: yes Labs d/w pt.   EpiPen cautions discussed with patient.  PMH and SH reviewed  Meds, vitals, and allergies reviewed.   ROS: Per HPI.  Unless specifically indicated otherwise in HPI, the patient denies:  General: fever. Eyes: acute vision changes ENT: sore throat Cardiovascular: chest pain Respiratory: SOB GI: vomiting GU: dysuria Musculoskeletal: acute back pain Derm:  acute rash Neuro: acute motor dysfunction Psych: worsening mood Endocrine: polydipsia Heme: bleeding Allergy: hayfever  GEN: nad, alert and oriented HEENT: ncat NECK: supple w/o LA CV: rrr. PULM: ctab, no inc wob ABD: soft, +bs EXT: no edema SKIN: no acute rash Right wrist Tinel positive on flexor side.  Negative on left wrist.  Normal grip bilaterally.  Distally neurovascular intact bilateral.

## 2020-02-06 NOTE — Patient Instructions (Addendum)
Let me know if you need a referral to Dr. [facility].  [phone].    Check with your insurance to see if they will cover the shingrix shot.  Don't change your meds.  Update me as needed.  Take care.  Glad to see you.

## 2020-02-08 DIAGNOSIS — R202 Paresthesia of skin: Secondary | ICD-10-CM | POA: Insufficient documentation

## 2020-02-08 NOTE — Assessment & Plan Note (Signed)
Continue current meds.  He will update me as needed.  Continue work on diet and exercise. 

## 2020-02-08 NOTE — Assessment & Plan Note (Signed)
No ADE on med.  Anxiety controlled, no Si/Hi.  Continue as is.  He agrees.

## 2020-02-08 NOTE — Assessment & Plan Note (Signed)
Could very likely have carpal tunnel symptoms exacerbated by nocturnal flexion.  Discussed nocturnal extension splinting and he can update me as needed.  He agrees.  Still okay for outpatient follow-up.

## 2020-02-08 NOTE — Assessment & Plan Note (Signed)
  Living will d/w pt.  Wife would be designated if patient were incapacitated.   

## 2020-02-08 NOTE — Assessment & Plan Note (Signed)
Tetanus 2016 Flu 2020 PNA not due yet. d/w pt.  Shingles d/w pt.   covid vaccine d/w pt.  PSA wnl, d/w pt.  Colonoscopy 2020 Diet and exercise d/w pt. Encouraged both. Living will d/w pt. Wife would be designated if patient were incapacitated.  HIV and HCV prev neg.  Discussed quitting chewing tobacco, encouraged.

## 2020-02-08 NOTE — Assessment & Plan Note (Signed)
Continue current meds.  He will update me as needed.  Continue work on diet and exercise.

## 2020-03-18 DIAGNOSIS — H2512 Age-related nuclear cataract, left eye: Secondary | ICD-10-CM | POA: Diagnosis not present

## 2020-04-12 DIAGNOSIS — I10 Essential (primary) hypertension: Secondary | ICD-10-CM | POA: Diagnosis not present

## 2020-04-12 DIAGNOSIS — H2512 Age-related nuclear cataract, left eye: Secondary | ICD-10-CM | POA: Diagnosis not present

## 2020-04-20 ENCOUNTER — Other Ambulatory Visit: Payer: Self-pay

## 2020-04-20 ENCOUNTER — Encounter: Payer: Self-pay | Admitting: Ophthalmology

## 2020-04-23 ENCOUNTER — Other Ambulatory Visit
Admission: RE | Admit: 2020-04-23 | Discharge: 2020-04-23 | Disposition: A | Discharge status: Home / Self Care | Payer: BC Managed Care – PPO | Source: Ambulatory Visit | Attending: Ophthalmology | Admitting: Ophthalmology

## 2020-04-23 DIAGNOSIS — Z20822 Contact with and (suspected) exposure to covid-19: Secondary | ICD-10-CM | POA: Insufficient documentation

## 2020-04-23 DIAGNOSIS — Z01812 Encounter for preprocedural laboratory examination: Secondary | ICD-10-CM | POA: Insufficient documentation

## 2020-04-23 NOTE — Discharge Instructions (Signed)

## 2020-04-24 LAB — SARS CORONAVIRUS 2 (TAT 6-24 HRS): SARS Coronavirus 2: NEGATIVE

## 2020-04-27 ENCOUNTER — Ambulatory Visit: Payer: BC Managed Care – PPO | Admitting: Anesthesiology

## 2020-04-27 ENCOUNTER — Ambulatory Visit
Admission: RE | Admit: 2020-04-27 | Discharge: 2020-04-27 | Disposition: A | Discharge status: Home / Self Care | Payer: BC Managed Care – PPO | Source: Ambulatory Visit | Attending: Ophthalmology | Admitting: Ophthalmology

## 2020-04-27 ENCOUNTER — Encounter: Payer: Self-pay | Admitting: Ophthalmology

## 2020-04-27 ENCOUNTER — Other Ambulatory Visit: Payer: Self-pay

## 2020-04-27 ENCOUNTER — Encounter
Admission: RE | Disposition: A | Discharge status: Home / Self Care | Payer: Self-pay | Source: Ambulatory Visit | Attending: Ophthalmology

## 2020-04-27 DIAGNOSIS — M5430 Sciatica, unspecified side: Secondary | ICD-10-CM | POA: Diagnosis not present

## 2020-04-27 DIAGNOSIS — Z8616 Personal history of COVID-19: Secondary | ICD-10-CM | POA: Insufficient documentation

## 2020-04-27 DIAGNOSIS — H2512 Age-related nuclear cataract, left eye: Secondary | ICD-10-CM | POA: Diagnosis not present

## 2020-04-27 DIAGNOSIS — K219 Gastro-esophageal reflux disease without esophagitis: Secondary | ICD-10-CM | POA: Diagnosis not present

## 2020-04-27 DIAGNOSIS — F419 Anxiety disorder, unspecified: Secondary | ICD-10-CM | POA: Diagnosis not present

## 2020-04-27 DIAGNOSIS — E7800 Pure hypercholesterolemia, unspecified: Secondary | ICD-10-CM | POA: Diagnosis not present

## 2020-04-27 DIAGNOSIS — Z87891 Personal history of nicotine dependence: Secondary | ICD-10-CM | POA: Insufficient documentation

## 2020-04-27 DIAGNOSIS — G43909 Migraine, unspecified, not intractable, without status migrainosus: Secondary | ICD-10-CM | POA: Diagnosis not present

## 2020-04-27 DIAGNOSIS — E785 Hyperlipidemia, unspecified: Secondary | ICD-10-CM | POA: Insufficient documentation

## 2020-04-27 DIAGNOSIS — H25812 Combined forms of age-related cataract, left eye: Secondary | ICD-10-CM | POA: Diagnosis not present

## 2020-04-27 DIAGNOSIS — I10 Essential (primary) hypertension: Secondary | ICD-10-CM | POA: Diagnosis not present

## 2020-04-27 DIAGNOSIS — M1990 Unspecified osteoarthritis, unspecified site: Secondary | ICD-10-CM | POA: Diagnosis not present

## 2020-04-27 HISTORY — DX: Unspecified osteoarthritis, unspecified site: M19.90

## 2020-04-27 HISTORY — PX: CATARACT EXTRACTION W/PHACO: SHX586

## 2020-04-27 SURGERY — PHACOEMULSIFICATION, CATARACT, WITH IOL INSERTION
Anesthesia: Monitor Anesthesia Care | Site: Eye | Laterality: Left

## 2020-04-27 MED ORDER — ARMC OPHTHALMIC DILATING DROPS
1.0000 "application " | OPHTHALMIC | Status: DC | PRN
  Administered 2020-04-27 (×3): 1 via OPHTHALMIC

## 2020-04-27 MED ORDER — FENTANYL CITRATE (PF) 100 MCG/2ML IJ SOLN
INTRAMUSCULAR | Status: DC | PRN
  Administered 2020-04-27: 50 ug via INTRAVENOUS

## 2020-04-27 MED ORDER — LACTATED RINGERS IV SOLN: 100.0000 mL/h | INTRAVENOUS | Status: DC

## 2020-04-27 MED ORDER — NA CHONDROIT SULF-NA HYALURON 40-17 MG/ML IO SOLN
INTRAOCULAR | Status: DC | PRN
  Administered 2020-04-27: 1 mL via INTRAOCULAR

## 2020-04-27 MED ORDER — TETRACAINE HCL 0.5 % OP SOLN
1.0000 [drp] | OPHTHALMIC | Status: DC | PRN
  Administered 2020-04-27 (×3): 1 [drp] via OPHTHALMIC

## 2020-04-27 MED ORDER — MOXIFLOXACIN HCL 0.5 % OP SOLN
OPHTHALMIC | Status: DC | PRN
  Administered 2020-04-27: 0.2 mL via OPHTHALMIC

## 2020-04-27 MED ORDER — MIDAZOLAM HCL 2 MG/2ML IJ SOLN
INTRAMUSCULAR | Status: DC | PRN
  Administered 2020-04-27: 2 mg via INTRAVENOUS

## 2020-04-27 MED ORDER — EPINEPHRINE PF 1 MG/ML IJ SOLN
INTRAOCULAR | Status: DC | PRN
  Administered 2020-04-27: 52 mL via OPHTHALMIC

## 2020-04-27 MED ORDER — LIDOCAINE HCL (PF) 2 % IJ SOLN
INTRAOCULAR | Status: DC | PRN
  Administered 2020-04-27: 1 mL

## 2020-04-27 MED ORDER — BRIMONIDINE TARTRATE-TIMOLOL 0.2-0.5 % OP SOLN
OPHTHALMIC | Status: DC | PRN
  Administered 2020-04-27: 1 [drp] via OPHTHALMIC

## 2020-04-27 SURGICAL SUPPLY — 20 items
CANNULA ANT/CHMB 27G (MISCELLANEOUS) ×2 IMPLANT
CANNULA ANT/CHMB 27GA (MISCELLANEOUS) ×4 IMPLANT
GLOVE SURG LX 8.0 MICRO (GLOVE) ×1
GLOVE SURG LX STRL 8.0 MICRO (GLOVE) ×1 IMPLANT
GLOVE SURG TRIUMPH 8.0 PF LTX (GLOVE) ×2 IMPLANT
GOWN STRL REUS W/ TWL LRG LVL3 (GOWN DISPOSABLE) ×2 IMPLANT
GOWN STRL REUS W/TWL LRG LVL3 (GOWN DISPOSABLE) ×4
LENS IOL DIOP 20.5 (Intraocular Lens) ×2 IMPLANT
LENS IOL TECNIS MONO 20.5 (Intraocular Lens) IMPLANT
MARKER SKIN DUAL TIP RULER LAB (MISCELLANEOUS) ×2 IMPLANT
NDL FILTER BLUNT 18X1 1/2 (NEEDLE) ×1 IMPLANT
NEEDLE FILTER BLUNT 18X 1/2SAF (NEEDLE) ×1
NEEDLE FILTER BLUNT 18X1 1/2 (NEEDLE) ×1 IMPLANT
PACK EYE AFTER SURG (MISCELLANEOUS) ×2 IMPLANT
PACK OPTHALMIC (MISCELLANEOUS) ×2 IMPLANT
PACK PORFILIO (MISCELLANEOUS) ×2 IMPLANT
SYR 3ML LL SCALE MARK (SYRINGE) ×2 IMPLANT
SYR TB 1ML LUER SLIP (SYRINGE) ×2 IMPLANT
WATER STERILE IRR 250ML POUR (IV SOLUTION) ×2 IMPLANT
WIPE NON LINTING 3.25X3.25 (MISCELLANEOUS) ×2 IMPLANT

## 2020-04-27 NOTE — Transfer of Care (Signed)
Immediate Anesthesia Transfer of Care Note  Patient: Martin Cole  Procedure(s) Performed: CATARACT EXTRACTION PHACO AND INTRAOCULAR LENS PLACEMENT (IOC) LEFT 6.86  00:57.6 (Left Eye)  Patient Location: PACU  Anesthesia Type: MAC  Level of Consciousness: awake, alert  and patient cooperative  Airway and Oxygen Therapy: Patient Spontanous Breathing and Patient connected to supplemental oxygen  Post-op Assessment: Post-op Vital signs reviewed, Patient's Cardiovascular Status Stable, Respiratory Function Stable, Patent Airway and No signs of Nausea or vomiting  Post-op Vital Signs: Reviewed and stable  Complications: No apparent anesthesia complications

## 2020-04-27 NOTE — Anesthesia Postprocedure Evaluation (Signed)
Anesthesia Post Note  Patient: Martin Cole  Procedure(s) Performed: CATARACT EXTRACTION PHACO AND INTRAOCULAR LENS PLACEMENT (IOC) LEFT 6.86  00:57.6 (Left Eye)     Patient location during evaluation: PACU Anesthesia Type: MAC Level of consciousness: awake and alert Pain management: pain level controlled Vital Signs Assessment: post-procedure vital signs reviewed and stable Respiratory status: spontaneous breathing, nonlabored ventilation, respiratory function stable and patient connected to nasal cannula oxygen Cardiovascular status: stable and blood pressure returned to baseline Postop Assessment: no apparent nausea or vomiting Anesthetic complications: no    Tarnesha Ulloa A  Caidyn Blossom

## 2020-04-27 NOTE — H&P (Signed)
All labs reviewed. Abnormal studies sent to patients PCP when indicated.  Previous H&P reviewed, patient examined, there are NO CHANGES.  Martin Porfilio5/25/202110:50 AM

## 2020-04-27 NOTE — Anesthesia Preprocedure Evaluation (Signed)
Anesthesia Evaluation  Patient identified by MRN, date of birth, ID band Patient awake    Reviewed: Allergy & Precautions, NPO status , Patient's Chart, lab work & pertinent test results, reviewed documented beta blocker date and time   History of Anesthesia Complications Negative for: history of anesthetic complications  Airway Mallampati: II  TM Distance: >3 FB Neck ROM: Full    Dental  (+) Upper Dentures, Lower Dentures   Pulmonary former smoker,   COVID+ 09/2019   breath sounds clear to auscultation       Cardiovascular hypertension, (-) angina(-) DOE  Rhythm:Regular Rate:Normal   HLD   Neuro/Psych  Headaches (Migraines), Anxiety  Neuromuscular disease (Sciatica)    GI/Hepatic GERD  , Dysphagia   Endo/Other    Renal/GU      Musculoskeletal  (+) Arthritis ,   Abdominal   Peds  Hematology   Anesthesia Other Findings   Reproductive/Obstetrics                             Anesthesia Physical Anesthesia Plan  ASA: II  Anesthesia Plan: MAC   Post-op Pain Management:    Induction: Intravenous  PONV Risk Score and Plan: 1 and TIVA, Midazolam and Treatment may vary due to age or medical condition  Airway Management Planned: Nasal Cannula  Additional Equipment:   Intra-op Plan:   Post-operative Plan:   Informed Consent: I have reviewed the patients History and Physical, chart, labs and discussed the procedure including the risks, benefits and alternatives for the proposed anesthesia with the patient or authorized representative who has indicated his/her understanding and acceptance.       Plan Discussed with: CRNA and Anesthesiologist  Anesthesia Plan Comments:         Anesthesia Quick Evaluation

## 2020-04-27 NOTE — Anesthesia Procedure Notes (Signed)
Procedure Name: MAC Performed by: Dontasia Miranda, CRNA Pre-anesthesia Checklist: Patient identified, Emergency Drugs available, Suction available, Timeout performed and Patient being monitored Patient Re-evaluated:Patient Re-evaluated prior to induction Oxygen Delivery Method: Nasal cannula Placement Confirmation: positive ETCO2       

## 2020-04-27 NOTE — Op Note (Signed)
PREOPERATIVE DIAGNOSIS:  Nuclear sclerotic cataract of the left eye.   POSTOPERATIVE DIAGNOSIS:  Nuclear sclerotic cataract of the left eye.   OPERATIVE PROCEDURE:@   SURGEON:  Galen Manila, MD.   ANESTHESIA:  Anesthesiologist: Heniser, Burman Foster, MD CRNA: Jimmy Picket, CRNA  1.      Managed anesthesia care. 2.     0.7ml of Shugarcaine was instilled following the paracentesis   COMPLICATIONS:  None.   TECHNIQUE:   Stop and chop   DESCRIPTION OF PROCEDURE:  The patient was examined and consented in the preoperative holding area where the aforementioned topical anesthesia was applied to the left eye and then brought back to the Operating Room where the left eye was prepped and draped in the usual sterile ophthalmic fashion and a lid speculum was placed. A paracentesis was created with the side port blade and the anterior chamber was filled with viscoelastic. A near clear corneal incision was performed with the steel keratome. A continuous curvilinear capsulorrhexis was performed with a cystotome followed by the capsulorrhexis forceps. Hydrodissection and hydrodelineation were carried out with BSS on a blunt cannula. The lens was removed in a stop and chop  technique and the remaining cortical material was removed with the irrigation-aspiration handpiece. The capsular bag was inflated with viscoelastic and the Technis [id number] lens was placed in the capsular bag without complication. The remaining viscoelastic was removed from the eye with the irrigation-aspiration handpiece. The wounds were hydrated. The anterior chamber was flushed with BSS and the eye was inflated to physiologic pressure. 0.63ml Vigamox was placed in the anterior chamber. The wounds were found to be water tight. The eye was dressed with Combigan. The patient was given protective glasses to wear throughout the day and a shield with which to sleep tonight. The patient was also given drops with which to begin a drop regimen today  and will follow-up with me in one day. Implant Name Type Inv. Item Serial No. Manufacturer Lot No. LRB No. Used Action  LENS IOL DIOP 20.5 - [id number] Intraocular Lens LENS IOL DIOP 20.5 [id number]  Left 1 Implanted    Procedure(s): CATARACT EXTRACTION PHACO AND INTRAOCULAR LENS PLACEMENT (IOC) LEFT 6.86  00:57.6 (Left)  Electronically signed: Rommel Hogston 04/27/2020 11:17 AM

## 2020-04-28 ENCOUNTER — Encounter: Payer: Self-pay | Admitting: *Deleted

## 2020-05-31 ENCOUNTER — Ambulatory Visit: Payer: BC Managed Care – PPO | Admitting: Podiatry

## 2021-01-30 ENCOUNTER — Other Ambulatory Visit: Payer: Self-pay | Admitting: Family Medicine

## 2021-01-30 DIAGNOSIS — Z125 Encounter for screening for malignant neoplasm of prostate: Secondary | ICD-10-CM

## 2021-01-30 DIAGNOSIS — I10 Essential (primary) hypertension: Secondary | ICD-10-CM

## 2021-02-10 ENCOUNTER — Other Ambulatory Visit (INDEPENDENT_AMBULATORY_CARE_PROVIDER_SITE_OTHER): Payer: BC Managed Care – PPO

## 2021-02-10 ENCOUNTER — Other Ambulatory Visit: Payer: Self-pay

## 2021-02-10 DIAGNOSIS — Z125 Encounter for screening for malignant neoplasm of prostate: Secondary | ICD-10-CM | POA: Diagnosis not present

## 2021-02-10 DIAGNOSIS — I10 Essential (primary) hypertension: Secondary | ICD-10-CM

## 2021-02-10 LAB — LIPID PANEL
Cholesterol: 144 mg/dL (ref 0–200)
HDL: 24.4 mg/dL — ABNORMAL LOW (ref >39.00)
NonHDL: 119.76
Total CHOL/HDL Ratio: 6
Triglycerides: 314 mg/dL — ABNORMAL HIGH (ref 0.0–149.0)
VLDL: 62.8 mg/dL — ABNORMAL HIGH (ref 0.0–40.0)

## 2021-02-10 LAB — COMPREHENSIVE METABOLIC PANEL
ALT: 17 U/L (ref 0–53)
AST: 15 U/L (ref 0–37)
Albumin: 4.3 g/dL (ref 3.5–5.2)
Alkaline Phosphatase: 71 U/L (ref 39–117)
BUN: 18 mg/dL (ref 6–23)
CO2: 32 mEq/L (ref 19–32)
Calcium: 9.8 mg/dL (ref 8.4–10.5)
Chloride: 101 mEq/L (ref 96–112)
Creatinine, Ser: 1.47 mg/dL (ref 0.40–1.50)
GFR: 50.96 mL/min — ABNORMAL LOW (ref >60.00)
Glucose, Bld: 93 mg/dL (ref 70–99)
Potassium: 4.3 mEq/L (ref 3.5–5.1)
Sodium: 140 mEq/L (ref 135–145)
Total Bilirubin: 0.6 mg/dL (ref 0.2–1.2)
Total Protein: 6.7 g/dL (ref 6.0–8.3)

## 2021-02-10 LAB — LDL CHOLESTEROL, DIRECT: Direct LDL: 65 mg/dL

## 2021-02-10 LAB — PSA, MEDICARE: PSA: 0.81 ng/ml (ref 0.10–4.00)

## 2021-02-15 ENCOUNTER — Encounter: Payer: Self-pay | Admitting: Family Medicine

## 2021-02-15 ENCOUNTER — Other Ambulatory Visit: Payer: Self-pay

## 2021-02-15 ENCOUNTER — Ambulatory Visit (INDEPENDENT_AMBULATORY_CARE_PROVIDER_SITE_OTHER): Payer: BC Managed Care – PPO | Admitting: Family Medicine

## 2021-02-15 VITALS — BP 118/80 | HR 76 | Temp 97.9°F | Ht 72.0 in | Wt 197.0 lb

## 2021-02-15 DIAGNOSIS — E7800 Pure hypercholesterolemia, unspecified: Secondary | ICD-10-CM

## 2021-02-15 DIAGNOSIS — R0602 Shortness of breath: Secondary | ICD-10-CM | POA: Diagnosis not present

## 2021-02-15 DIAGNOSIS — Z7189 Other specified counseling: Secondary | ICD-10-CM

## 2021-02-15 DIAGNOSIS — Z0000 Encounter for general adult medical examination without abnormal findings: Secondary | ICD-10-CM | POA: Diagnosis not present

## 2021-02-15 DIAGNOSIS — M25519 Pain in unspecified shoulder: Secondary | ICD-10-CM

## 2021-02-15 DIAGNOSIS — I10 Essential (primary) hypertension: Secondary | ICD-10-CM

## 2021-02-15 DIAGNOSIS — R5383 Other fatigue: Secondary | ICD-10-CM

## 2021-02-15 MED ORDER — SIMVASTATIN 40 MG PO TABS: 40.0000 mg | ORAL_TABLET | Freq: Every day | ORAL | 3 refills | Status: DC

## 2021-02-15 MED ORDER — PAROXETINE HCL 20 MG PO TABS: 20.0000 mg | ORAL_TABLET | ORAL | 3 refills | Status: DC

## 2021-02-15 MED ORDER — VERAPAMIL HCL ER 120 MG PO TBCR: 240.0000 mg | EXTENDED_RELEASE_TABLET | Freq: Every day | ORAL | 3 refills | Status: DC

## 2021-02-15 MED ORDER — BISOPROLOL-HYDROCHLOROTHIAZIDE 5-6.25 MG PO TABS: 2.0000 | ORAL_TABLET | Freq: Every day | ORAL | 3 refills | Status: DC

## 2021-02-15 NOTE — Progress Notes (Signed)
This visit occurred during the SARS-CoV-2 public health emergency.  Safety protocols were in place, including screening questions prior to the visit, additional usage of staff PPE, and extensive cleaning of exam room while observing appropriate contact time as indicated for disinfecting solutions.  CPE- See plan.  Routine anticipatory guidance given to patient.  See health maintenance.  The possibility exists that previously documented standard health maintenance information may have been brought forward from a previous encounter into this note.  If needed, that same information has been updated to reflect the current situation based on today's encounter.    Tetanus 2016 Flu 2021 PNA not due yet. Shingles d/w pt.   covid vaccine prev done PSA wnl, d/w pt.  Colonoscopy 2020 Diet and exercise d/w pt. Encouraged both. Living will d/w pt. Wife would be designated if patient were incapacitated.  HIV and HCV prev neg.  Discussed quitting chewing tobacco, encouraged.  Neck popping and cracking, pain can radiate down the back or to the shoulders. L>R sided neck pain. And B shoulder pain that is separate from the neck pain.  Pain sleeping on either side at night due to shoulder pain. No help with advil.    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     Fatigue noted.  D/w pt about follow up labs.  See notes on labs.  Hypertension:    Using medication without problems or lightheadedness: yes Chest pain with exertion:no Edema:no Short of breath: he noted some SOB with sig exertion.  He had trouble pulling a 190 lbs deer out of the wood this year but not with ADLs.    Elevated Cholesterol: Using medications without problems: yes Muscle aches: likely not from statin, d/w pt.   Diet compliance: encouraged.   Exercise: encouraged.  PMH and SH reviewed  Meds, vitals, and allergies reviewed.   ROS: Per HPI.  Unless specifically indicated otherwise in HPI, the patient  denies:  General: fever. Eyes: acute vision changes ENT: sore throat Cardiovascular: chest pain Respiratory: SOB GI: vomiting GU: dysuria Musculoskeletal: acute back pain Derm: acute rash Neuro: acute motor dysfunction Psych: worsening mood Endocrine: polydipsia Heme: bleeding Allergy: hayfever  GEN: nad, alert and oriented HEENT: NCAT NECK: supple w/o LA, neck is not stiff but he has pain with range of motion with audible crepitus on neck rotation to the left. CV: rrr. PULM: ctab, no inc wob ABD: soft, +bs EXT: no edema SKIN: no acute rash Bilateral shoulder pain with internal and external rotation, abduction.  AC joint is tender to palpation bilaterally.  Distally grossly neurovascularly intact and he does not have nearly as much pain with pendulum swings.  He has similar bilateral shoulder exams.

## 2021-02-15 NOTE — Patient Instructions (Addendum)
Ask about an early morning nonfasting labs appointment.  Take care.  Glad to see you.  We'll call about seeing ortho.

## 2021-02-16 NOTE — Assessment & Plan Note (Signed)
I suspect he has rotator cuff symptoms bilaterally but I am also concerned about his glenohumeral joint and his AC joint.  He is also having neck pain with range of motion.  For a lot of reasons I think it makes sense to see orthopedics and he agrees.  Referral placed.

## 2021-02-16 NOTE — Assessment & Plan Note (Signed)
Continue simvastatin.  Return for follow-up labs.

## 2021-02-16 NOTE — Assessment & Plan Note (Signed)
  Living will d/w pt.  Wife would be designated if patient were incapacitated.   

## 2021-02-16 NOTE — Assessment & Plan Note (Signed)
Reasonable to check routine labs given his history of fatigue.  See notes on labs.  He agrees with plan.

## 2021-02-16 NOTE — Assessment & Plan Note (Signed)
Tetanus 2016 Flu 2021 PNA not due yet. Shingles d/w pt.   covid vaccine prev done PSA wnl, d/w pt.  Colonoscopy 2020 Diet and exercise d/w pt. Encouraged both. Living will d/w pt. Wife would be designated if patient were incapacitated.  HIV and HCV prev neg.  Discussed quitting chewing tobacco, encouraged.

## 2021-02-16 NOTE — Assessment & Plan Note (Addendum)
He noted some shortness of breath when he was hunting, when he was hauling a deer of the woods.  This is atypical for him.  He is not having chest pain.  We talked about options.  We got an EKG done today.  It shows a right bundle branch block but no other issues that were significant.  Discussed.  Overall his EKG is reassuring.  Assuming his labs are fine and I think it makes sense to refer him to cardiology given his relative decrease in exercise tolerance.  He agreed.  Okay for outpatient follow-up.

## 2021-02-18 ENCOUNTER — Other Ambulatory Visit (INDEPENDENT_AMBULATORY_CARE_PROVIDER_SITE_OTHER): Payer: BC Managed Care – PPO

## 2021-02-18 ENCOUNTER — Other Ambulatory Visit: Payer: Self-pay

## 2021-02-18 DIAGNOSIS — R5383 Other fatigue: Secondary | ICD-10-CM | POA: Diagnosis not present

## 2021-02-18 DIAGNOSIS — R0602 Shortness of breath: Secondary | ICD-10-CM

## 2021-02-18 LAB — BASIC METABOLIC PANEL
BUN: 17 mg/dL (ref 6–23)
CO2: 30 mEq/L (ref 19–32)
Calcium: 9.7 mg/dL (ref 8.4–10.5)
Chloride: 100 mEq/L (ref 96–112)
Creatinine, Ser: 1.29 mg/dL (ref 0.40–1.50)
GFR: 59.6 mL/min — ABNORMAL LOW (ref >60.00)
Glucose, Bld: 91 mg/dL (ref 70–99)
Potassium: 4.3 mEq/L (ref 3.5–5.1)
Sodium: 139 mEq/L (ref 135–145)

## 2021-02-18 LAB — CBC WITH DIFFERENTIAL/PLATELET
Basophils Absolute: 0 10*3/uL (ref 0.0–0.1)
Basophils Relative: 0.5 % (ref 0.0–3.0)
Eosinophils Absolute: 0.2 10*3/uL (ref 0.0–0.7)
Eosinophils Relative: 2.8 % (ref 0.0–5.0)
HCT: 40.6 % (ref 39.0–52.0)
Hemoglobin: 14.2 g/dL (ref 13.0–17.0)
Lymphocytes Relative: 19.4 % (ref 12.0–46.0)
Lymphs Abs: 1.2 10*3/uL (ref 0.7–4.0)
MCHC: 34.9 g/dL (ref 30.0–36.0)
MCV: 88.1 fl (ref 78.0–100.0)
Monocytes Absolute: 0.6 10*3/uL (ref 0.1–1.0)
Monocytes Relative: 9.5 % (ref 3.0–12.0)
Neutro Abs: 4.3 10*3/uL (ref 1.4–7.7)
Neutrophils Relative %: 67.8 % (ref 43.0–77.0)
Platelets: 180 10*3/uL (ref 150.0–400.0)
RBC: 4.61 Mil/uL (ref 4.22–5.81)
RDW: 13 % (ref 11.5–15.5)
WBC: 6.3 10*3/uL (ref 4.0–10.5)

## 2021-02-18 LAB — BRAIN NATRIURETIC PEPTIDE: Pro B Natriuretic peptide (BNP): 59 pg/mL (ref 0.0–100.0)

## 2021-02-18 LAB — TSH: TSH: 2.22 u[IU]/mL (ref 0.35–4.50)

## 2021-02-18 LAB — VITAMIN B12: Vitamin B-12: 288 pg/mL (ref 211–911)

## 2021-02-18 LAB — TESTOSTERONE: Testosterone: 294.82 ng/dL — ABNORMAL LOW (ref 300.00–890.00)

## 2021-02-21 ENCOUNTER — Other Ambulatory Visit: Payer: Self-pay | Admitting: Family Medicine

## 2021-02-21 DIAGNOSIS — R0602 Shortness of breath: Secondary | ICD-10-CM

## 2021-02-28 DIAGNOSIS — M542 Cervicalgia: Secondary | ICD-10-CM | POA: Diagnosis not present

## 2021-02-28 DIAGNOSIS — M25511 Pain in right shoulder: Secondary | ICD-10-CM | POA: Diagnosis not present

## 2021-02-28 DIAGNOSIS — M25512 Pain in left shoulder: Secondary | ICD-10-CM | POA: Diagnosis not present

## 2021-03-07 DIAGNOSIS — M542 Cervicalgia: Secondary | ICD-10-CM | POA: Diagnosis not present

## 2021-03-10 DIAGNOSIS — M542 Cervicalgia: Secondary | ICD-10-CM | POA: Diagnosis not present

## 2021-03-10 DIAGNOSIS — M25511 Pain in right shoulder: Secondary | ICD-10-CM | POA: Diagnosis not present

## 2021-03-15 NOTE — Progress Notes (Signed)
New Outpatient Visit Date: 03/16/2021  Referring Provider: Joaquim Nam, MD [address] [place],  [place] [address]  Chief Complaint: Shortness of breath  HPI:  Mr. Martin Cole is a 60 y.o. male who is being seen today for the evaluation of shortness of breath at the request of Dr. Para March. He has a history of hypertension, hyperlipidemia, migraine headaches, GERD, anxiety, and COVID-19 (09/2019). 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  He had to stop every 15 yards to catch his breath.  He had not experienced this in years past.  He denies chest pain.  He has not had any dyspnea with ADL's.  He has occasional brief flutters in his chest without associated symptoms.  He has not had lightheadedness, edema, or PND.  He notes orthopnea many years ago, which stopped when he quit smoking.  Mr. Messina reports having undergone a stress test 15-20 years ago and believes it was normal.  He is currently undergoing rheumatologic workup for joint pains, particularly involving his neck and shoulders.  --------------------------------------------------------------------------------------------------  Cardiovascular History & Procedures: Cardiovascular Problems:  Dyspnea on exertion  Risk Factors:  Hypertension, hyperlipidemia, male gender, family history, age > 33, and tobacco use.  Cath/PCI:  None.  CV Surgery:  None.  EP Procedures and Devices:  None.  Non-Invasive Evaluation(s):  None available; patient reports negative stress test 15-20 years ago.  Recent CV Pertinent Labs: Lab Results  Component Value Date   CHOL 144 02/10/2021   HDL 24.40 (L) 02/10/2021   LDLCALC 96 12/23/2013   LDLDIRECT 65.0 02/10/2021   TRIG 314.0 (H) 02/10/2021   CHOLHDL 6 02/10/2021   K 4.3 02/18/2021   BUN 17 02/18/2021   CREATININE 1.29 02/18/2021     --------------------------------------------------------------------------------------------------  Past Medical History:  Diagnosis Date  . Anxiety   . Arthritis    knees, shoulders  . COVID-19 09/08/2019  . GERD (gastroesophageal reflux disease)   . Headache(784.0)   . History of ETT 1999   wnl  . Hyperlipidemia   . Hypertension   . Lip swelling    With hives 2017. No clear trigger.  . Migraine aura without headache    better since started Paxil    Past Surgical History:  Procedure Laterality Date  . CATARACT EXTRACTION W/PHACO Left 04/27/2020   Procedure: CATARACT EXTRACTION PHACO AND INTRAOCULAR LENS PLACEMENT (IOC) LEFT 6.86  00:57.6;  Surgeon: Galen Manila, MD;  Location: [facility];  Service: Ophthalmology;  Laterality: Left;  . COLONOSCOPY    . ESOPHAGOGASTRODUODENOSCOPY  10/18/98   esoph stricture, esophagitis, duodenitis  . ESOPHAGOGASTRODUODENOSCOPY  12/99   esoph dilation  . KNEE ARTHROSCOPY  1999   left knee  . KNEE ARTHROSCOPY  12/03   right  (Dr. Chaney Malling)  . SKIN CANCER EXCISION  1983  . TOE SURGERY Right    2015  . TONSILLECTOMY  1968    Current Meds  Medication Sig  . aspirin 81 MG tablet Take 81 mg by mouth daily.  . bisoprolol-hydrochlorothiazide (ZIAC) 5-6.25 MG tablet Take 2 tablets by mouth daily.  Marland Kitchen EPINEPHrine (EPIPEN 2-PAK) 0.3 mg/0.3 mL IJ SOAJ injection Inject 0.3 mLs (0.3 mg total) into the muscle as needed for anaphylaxis.  Marland Kitchen MOBIC 15 MG tablet Take 1 tablet by mouth daily as needed.  . Multiple Vitamin (MULTIVITAMIN) tablet Take 1 tablet by mouth daily.  Marland Kitchen omeprazole (PRILOSEC OTC) 20 MG tablet Take  1 tablet (20 mg total) by mouth as needed.  Marland Kitchen PARoxetine (PAXIL) 20 MG tablet Take 1 tablet (20 mg total) by mouth every morning.  . simvastatin (ZOCOR) 40 MG tablet Take 1 tablet (40 mg total) by mouth at bedtime.  . verapamil (CALAN-SR) 120 MG CR tablet Take 2 tablets (240 mg total) by mouth daily.    Allergies: Ace  inhibitors, Angiotensin receptor blockers, and Lipitor [atorvastatin]  Social History   Tobacco Use  . Smoking status: Former Smoker    Packs/day: 1.00    Years: 19.00    Pack years: 19.00    Quit date: 1997    Years since quitting: 25.2  . Smokeless tobacco: Current User    Types: Chew  Vaping Use  . Vaping Use: Never used  Substance Use Topics  . Alcohol use: No    Comment: rare occassions  . Drug use: No    Family History  Problem Relation Age of Onset  . Hypertension Mother   . Hyperlipidemia Mother   . Depression Mother   . Dementia Mother   . COPD Father   . Alcohol abuse Father   . Cancer Father        lung  . Cancer Sister        breast, mastectomy 2007  . Heart disease Sister        mitral valve prolapse  . Prostate cancer Maternal Uncle   . Colon cancer Neg Hx   . Esophageal cancer Neg Hx   . Rectal cancer Neg Hx     Review of Systems: A 12-system review of systems was performed and was negative except as noted in the HPI.  --------------------------------------------------------------------------------------------------  Physical Exam: BP 106/70 (BP Location: Right Arm, Patient Position: Sitting, Cuff Size: Normal)   Pulse (!) 56   Ht 6' (1.829 m)   Wt 190 lb (86.2 kg)   SpO2 98%   BMI 25.77 kg/m   General:  NAD. HEENT: No conjunctival pallor or scleral icterus. Facemask in place. Neck: Supple without lymphadenopathy, thyromegaly, JVD, or HJR. No carotid bruit. Lungs: Normal work of breathing. Clear to auscultation bilaterally without wheezes or crackles. Heart: Bradycardic but regular without murmurs, rubs, or gallops. Non-displaced PMI. Abd: Bowel sounds present. Soft, NT/ND without hepatosplenomegaly Ext: No lower extremity edema. Radial, PT, and DP pulses are 2+ bilaterally Skin: Warm and dry without rash. Neuro: CNIII-XII intact. Strength and fine-touch sensation intact in upper and lower extremities bilaterally. Psych: Normal mood and  affect.  EKG:  Sinus bradycardia with RBBB.  No significant change from prior tracing on 02/16/2021.  Lab Results  Component Value Date   WBC 6.3 02/18/2021   HGB 14.2 02/18/2021   HCT 40.6 02/18/2021   MCV 88.1 02/18/2021   PLT 180.0 02/18/2021    Lab Results  Component Value Date   NA 139 02/18/2021   K 4.3 02/18/2021   CL 100 02/18/2021   CO2 30 02/18/2021   BUN 17 02/18/2021   CREATININE 1.29 02/18/2021   GLUCOSE 91 02/18/2021   ALT 17 02/10/2021    Lab Results  Component Value Date   CHOL 144 02/10/2021   HDL 24.40 (L) 02/10/2021   LDLCALC 96 12/23/2013   LDLDIRECT 65.0 02/10/2021   TRIG 314.0 (H) 02/10/2021   CHOLHDL 6 02/10/2021     --------------------------------------------------------------------------------------------------  ASSESSMENT AND PLAN: Dyspnea on exertion and abnormal EKG: This has only been noticeable with very strenuous activity such as hauling a deer.  It is new  compared with years past.  Physical exam today is unremarkable other than mild sinus bradycardia.  EKG demonstrates stable RBBB as seen on prior tracing last month; more remote tracings are not available for comparison.  As Mr. Rana has multiple cardiac risk factors, ischemic heart disease is certainly a concern.  I have recommended that we perform a pharmacologic myocardial perfusion stress test, as arthritis and underlying RBBB would complicate exercise tolerance test.  We will also obtain a transthoracic echocardiogram to exclude structural abnormalities.  I advised him to seek immediate medical attention if he were to develop persistent dyspnea or chest pain.  Shared Decision Making/Informed Consent The risks [chest pain, shortness of breath, cardiac arrhythmias, dizziness, blood pressure fluctuations, myocardial infarction, stroke/transient ischemic attack, nausea, vomiting, allergic reaction, radiation exposure, metallic taste sensation and life-threatening complications (estimated to  be 1 in 10,000)], benefits (risk stratification, diagnosing coronary artery disease, treatment guidance) and alternatives of a nuclear stress test were discussed in detail with Mr. Mans and he agrees to proceed.  Tobacco use: Mr. Boylen has a history of remote smoking but continues to chew tobacco.  I have encouraged him to quit.  Hypertension: Blood pressure well-controlled.  Continue current medications.  Hyperlipidemia: Recent lipid panel showed good LDL (65) and mildly elevated triglycerides.  We will plan to continue current medications.  Follow-up: Return to clinic in 1 month.   Yvonne Kendall, MD 03/16/2021 9:13 AM

## 2021-03-16 ENCOUNTER — Other Ambulatory Visit: Payer: Self-pay

## 2021-03-16 ENCOUNTER — Ambulatory Visit (INDEPENDENT_AMBULATORY_CARE_PROVIDER_SITE_OTHER): Payer: BC Managed Care – PPO | Admitting: Internal Medicine

## 2021-03-16 ENCOUNTER — Encounter: Payer: Self-pay | Admitting: Internal Medicine

## 2021-03-16 VITALS — BP 106/70 | HR 56 | Ht 72.0 in | Wt 190.0 lb

## 2021-03-16 DIAGNOSIS — R0600 Dyspnea, unspecified: Secondary | ICD-10-CM | POA: Diagnosis not present

## 2021-03-16 DIAGNOSIS — Z720 Tobacco use: Secondary | ICD-10-CM | POA: Insufficient documentation

## 2021-03-16 DIAGNOSIS — I10 Essential (primary) hypertension: Secondary | ICD-10-CM

## 2021-03-16 DIAGNOSIS — R9431 Abnormal electrocardiogram [ECG] [EKG]: Secondary | ICD-10-CM | POA: Diagnosis not present

## 2021-03-16 DIAGNOSIS — R0609 Other forms of dyspnea: Secondary | ICD-10-CM | POA: Insufficient documentation

## 2021-03-16 DIAGNOSIS — E782 Mixed hyperlipidemia: Secondary | ICD-10-CM | POA: Insufficient documentation

## 2021-03-16 NOTE — Patient Instructions (Signed)
Medication Instructions:  -  Your physician recommends that you continue on your current medications as directed. Please refer to the Current Medication list given to you today.  *If you need a refill on your cardiac medications before your next appointment, please call your pharmacy*   Lab Work: - none ordered  If you have labs (blood work) drawn today and your tests are completely normal, you will receive your results only by: Marland Kitchen MyChart Message (if you have MyChart) OR . A paper copy in the mail If you have any lab test that is abnormal or we need to change your treatment, we will call you to review the results.   Testing/Procedures:  1) Echocardiogram: - Your physician has requested that you have an echocardiogram. Echocardiography is a painless test that uses sound waves to create images of your heart. It provides your doctor with information about the size and shape of your heart and how well your heart's chambers and valves are working. This procedure takes approximately one hour. There are no restrictions for this procedure. There is a possibility that an IV may need to be started during your test to inject an image enhancing agent. This is done to obtain more optimal pictures of your heart. Therefore we ask that you do at least drink some water prior to coming in to hydrate your veins.    2) Lexiscan Myoview (Cardiac Nuclear Scan/ Chemical Stress Test) - Your physician has requested that you have a lexiscan myoview.  [facility] MYOVIEW  Your caregiver has ordered a Stress Test with nuclear imaging. The purpose of this test is to evaluate the blood supply to your heart muscle. This procedure is referred to as a "Non-Invasive Stress Test." This is because other than having an IV started in your vein, nothing is inserted or "invades" your body. Cardiac stress tests are done to find areas of poor blood flow to the heart by determining the extent of coronary artery disease (CAD). Some patients  exercise on a treadmill, which naturally increases the blood flow to your heart, while others who are  unable to walk on a treadmill due to physical limitations have a pharmacologic/chemical stress agent called Lexiscan . This medicine will mimic walking on a treadmill by temporarily increasing your coronary blood flow.   Please note: these test may take anywhere between 2-4 hours to complete  PLEASE REPORT TO [facility] MEDICAL MALL ENTRANCE  THE VOLUNTEERS AT THE FIRST DESK WILL DIRECT YOU WHERE TO GO  Date of Procedure:_____________________________________  Arrival Time for Procedure:______________________________  Instructions regarding medication:    __x__:  Hold bisoproprolol-hctz night before procedure and morning of procedure  __x__:  You may take the rest of your regular medications the morning of your test with enough water to get them down safely.   PLEASE NOTIFY THE OFFICE AT LEAST 24 HOURS IN ADVANCE IF YOU ARE UNABLE TO KEEP YOUR APPOINTMENT.  [phone] AND  PLEASE NOTIFY NUCLEAR MEDICINE AT [facility] AT LEAST 24 HOURS IN ADVANCE IF YOU ARE UNABLE TO KEEP YOUR APPOINTMENT. [phone]  How to prepare for your Myoview test:  1. Do not eat or drink after midnight 2. No caffeine for 24 hours prior to test 3. No smoking 24 hours prior to test. 4. Your medication may be taken with water.  If your doctor stopped a medication because of this test, do not take that medication. 5. Ladies, please do not wear dresses.  Skirts or pants are appropriate. Please wear a short sleeve  shirt. 6. No perfume, cologne or lotion. 7. Wear comfortable walking shoes. No heels!    Follow-Up: At [facility], you and your health needs are our priority.  As part of our continuing mission to provide you with exceptional heart care, we have created designated Provider Care Teams.  These Care Teams include your primary Cardiologist (physician) and Advanced Practice Providers (APPs -  Physician Assistants  and Nurse Practitioners) who all work together to provide you with the care you need, when you need it.  We recommend signing up for the patient portal called "MyChart".  Sign up information is provided on this After Visit Summary.  MyChart is used to connect with patients for Virtual Visits (Telemedicine).  Patients are able to view lab/test results, encounter notes, upcoming appointments, etc.  Non-urgent messages can be sent to your provider as well.   To learn more about what you can do with MyChart, go to [email or web address].    Your next appointment:   1 month(s)  The format for your next appointment:   In Person  Provider:   You may see Yvonne Kendall, MD or one of the following Advanced Practice Providers on your designated Care Team:    Nicolasa Ducking, NP  Eula Listen, PA-C  Marisue Ivan, PA-C  Cadence [place], [place]  Gillian Shields, NP    Other Instructions   Echocardiogram An echocardiogram is a test that uses sound waves (ultrasound) to produce images of the heart. Images from an echocardiogram can provide important information about:  Heart size and shape.  The size and thickness and movement of your heart's walls.  Heart muscle function and strength.  Heart valve function or if you have stenosis. Stenosis is when the heart valves are too narrow.  If blood is flowing backward through the heart valves (regurgitation).  A tumor or infectious growth around the heart valves.  Areas of heart muscle that are not working well because of poor blood flow or injury from a heart attack.  Aneurysm detection. An aneurysm is a weak or damaged part of an artery wall. The wall bulges out from the normal force of blood pumping through the body. Tell a health care provider about:  Any allergies you have.  All medicines you are taking, including vitamins, herbs, eye drops, creams, and over-the-counter medicines.  Any blood disorders you have.  Any surgeries  you have had.  Any medical conditions you have.  Whether you are pregnant or may be pregnant. What are the risks? Generally, this is a safe test. However, problems may occur, including an allergic reaction to dye (contrast) that may be used during the test. What happens before the test? No specific preparation is needed. You may eat and drink normally. What happens during the test?  You will take off your clothes from the waist up and put on a hospital gown.  Electrodes or electrocardiogram (ECG)patches may be placed on your chest. The electrodes or patches are then connected to a device that monitors your heart rate and rhythm.  You will lie down on a table for an ultrasound exam. A gel will be applied to your chest to help sound waves pass through your skin.  A handheld device, called a transducer, will be pressed against your chest and moved over your heart. The transducer produces sound waves that travel to your heart and bounce back (or "echo" back) to the transducer. These sound waves will be captured in real-time and changed into images of your heart that  can be viewed on a video monitor. The images will be recorded on a computer and reviewed by your health care provider.  You may be asked to change positions or hold your breath for a short time. This makes it easier to get different views or better views of your heart.  In some cases, you may receive contrast through an IV in one of your veins. This can improve the quality of the pictures from your heart. The procedure may vary among health care providers and hospitals.   What can I expect after the test? You may return to your normal, everyday life, including diet, activities, and medicines, unless your health care provider tells you not to do that. Follow these instructions at home:  It is up to you to get the results of your test. Ask your health care provider, or the department that is doing the test, when your results will be  ready.  Keep all follow-up visits. This is important. Summary  An echocardiogram is a test that uses sound waves (ultrasound) to produce images of the heart.  Images from an echocardiogram can provide important information about the size and shape of your heart, heart muscle function, heart valve function, and other possible heart problems.  You do not need to do anything to prepare before this test. You may eat and drink normally.  After the echocardiogram is completed, you may return to your normal, everyday life, unless your health care provider tells you not to do that. This information is not intended to replace advice given to you by your health care provider. Make sure you discuss any questions you have with your health care provider. Document Revised: 07/13/2020 Document Reviewed: 07/13/2020 Elsevier Patient Education  2021 Elsevier Inc.     Cardiac Nuclear Scan A cardiac nuclear scan is a test that is done to check the flow of blood to your heart. It is done when you are resting and when you are exercising. The test looks for problems such as:  Not enough blood reaching a portion of the heart.  The heart muscle not working as it should. You may need this test if:  You have heart disease.  You have had lab results that are not normal.  You have had heart surgery or a balloon procedure to open up blocked arteries (angioplasty).  You have chest pain.  You have shortness of breath. In this test, a special dye (tracer) is put into your bloodstream. The tracer will travel to your heart. A camera will then take pictures of your heart to see how the tracer moves through your heart. This test is usually done at a hospital and takes 2-4 hours. Tell a doctor about:  Any allergies you have.  All medicines you are taking, including vitamins, herbs, eye drops, creams, and over-the-counter medicines.  Any problems you or family members have had with anesthetic medicines.  Any  blood disorders you have.  Any surgeries you have had.  Any medical conditions you have.  Whether you are pregnant or may be pregnant. What are the risks? Generally, this is a safe test. However, problems may occur, such as:  Serious chest pain and heart attack. This is only a risk if the stress portion of the test is done.  Rapid heartbeat.  A feeling of warmth in your chest. This feeling usually does not last long.  Allergic reaction to the tracer. What happens before the test?  Ask your doctor about changing or stopping your  normal medicines. This is important.  Follow instructions from your doctor about what you cannot eat or drink.  Remove your jewelry on the day of the test. What happens during the test?  An IV tube will be inserted into one of your veins.  Your doctor will give you a small amount of tracer through the IV tube.  You will wait for 20-40 minutes while the tracer moves through your bloodstream.  Your heart will be monitored with an electrocardiogram (ECG).  You will lie down on an exam table.  Pictures of your heart will be taken for about 15-20 minutes.  You may also have a stress test. For this test, one of these things may be done: ? You will be asked to exercise on a treadmill or a stationary bike. ? You will be given medicines that will make your heart work harder. This is done if you are unable to exercise.  When blood flow to your heart has peaked, a tracer will again be given through the IV tube.  After 20-40 minutes, you will get back on the exam table. More pictures will be taken of your heart.  Depending on the tracer that is used, more pictures may need to be taken 3-4 hours later.  Your IV tube will be removed when the test is over. The test may vary among doctors and hospitals. What happens after the test?  Ask your doctor: ? Whether you can return to your normal schedule, including diet, activities, and medicines. ? Whether you  should drink more fluids. This will help to remove the tracer from your body. Drink enough fluid to keep your pee (urine) pale yellow.  Ask your doctor, or the department that is doing the test: ? When will my results be ready? ? How will I get my results? Summary  A cardiac nuclear scan is a test that is done to check the flow of blood to your heart.  Tell your doctor whether you are pregnant or may be pregnant.  Before the test, ask your doctor about changing or stopping your normal medicines. This is important.  Ask your doctor whether you can return to your normal activities. You may be asked to drink more fluids. This information is not intended to replace advice given to you by your health care provider. Make sure you discuss any questions you have with your health care provider. Document Revised: 03/12/2019 Document Reviewed: 05/06/2018 Elsevier Patient Education  2021 [employer]

## 2021-03-21 ENCOUNTER — Encounter
Admission: RE | Admit: 2021-03-21 | Discharge: 2021-03-21 | Disposition: A | Discharge status: Home / Self Care | Payer: BC Managed Care – PPO | Source: Ambulatory Visit | Attending: Internal Medicine | Admitting: Internal Medicine

## 2021-03-21 ENCOUNTER — Other Ambulatory Visit: Payer: Self-pay

## 2021-03-21 DIAGNOSIS — R9431 Abnormal electrocardiogram [ECG] [EKG]: Secondary | ICD-10-CM | POA: Insufficient documentation

## 2021-03-21 DIAGNOSIS — R0600 Dyspnea, unspecified: Secondary | ICD-10-CM | POA: Insufficient documentation

## 2021-03-21 DIAGNOSIS — R0609 Other forms of dyspnea: Secondary | ICD-10-CM

## 2021-03-21 LAB — NM MYOCAR MULTI W/SPECT W/WALL MOTION / EF
LV dias vol: 80 mL (ref 62–150)
LV sys vol: 20 mL
Peak HR: 85 {beats}/min
Percent HR: 53 %
Rest HR: 54 {beats}/min
SDS: 2
SRS: 7
SSS: 4
TID: 1.12

## 2021-03-21 MED ORDER — TECHNETIUM TC 99M TETROFOSMIN IV KIT
10.0000 | PACK | Freq: Once | INTRAVENOUS | Status: AC | PRN
  Administered 2021-03-21: 9.367 via INTRAVENOUS

## 2021-03-21 MED ORDER — TECHNETIUM TC 99M TETROFOSMIN IV KIT
30.0000 | PACK | Freq: Once | INTRAVENOUS | Status: AC | PRN
  Administered 2021-03-21: 31.733 via INTRAVENOUS

## 2021-03-21 MED ORDER — REGADENOSON 0.4 MG/5ML IV SOLN
0.4000 mg | Freq: Once | INTRAVENOUS | Status: AC
  Administered 2021-03-21: 0.4 mg via INTRAVENOUS

## 2021-03-22 DIAGNOSIS — M542 Cervicalgia: Secondary | ICD-10-CM | POA: Diagnosis not present

## 2021-03-22 DIAGNOSIS — M5032 Other cervical disc degeneration, mid-cervical region, unspecified level: Secondary | ICD-10-CM | POA: Diagnosis not present

## 2021-03-22 DIAGNOSIS — M5412 Radiculopathy, cervical region: Secondary | ICD-10-CM | POA: Diagnosis not present

## 2021-04-05 DIAGNOSIS — M2550 Pain in unspecified joint: Secondary | ICD-10-CM | POA: Diagnosis not present

## 2021-04-05 DIAGNOSIS — R768 Other specified abnormal immunological findings in serum: Secondary | ICD-10-CM | POA: Diagnosis not present

## 2021-04-05 DIAGNOSIS — M150 Primary generalized (osteo)arthritis: Secondary | ICD-10-CM | POA: Diagnosis not present

## 2021-04-12 ENCOUNTER — Other Ambulatory Visit: Payer: BC Managed Care – PPO

## 2021-04-14 ENCOUNTER — Ambulatory Visit: Payer: BC Managed Care – PPO | Admitting: Medical

## 2021-04-26 ENCOUNTER — Other Ambulatory Visit: Payer: Self-pay | Admitting: Family Medicine

## 2021-05-04 ENCOUNTER — Ambulatory Visit (INDEPENDENT_AMBULATORY_CARE_PROVIDER_SITE_OTHER): Payer: BC Managed Care – PPO

## 2021-05-04 ENCOUNTER — Other Ambulatory Visit: Payer: Self-pay

## 2021-05-04 DIAGNOSIS — R0609 Other forms of dyspnea: Secondary | ICD-10-CM

## 2021-05-04 DIAGNOSIS — R0600 Dyspnea, unspecified: Secondary | ICD-10-CM | POA: Diagnosis not present

## 2021-05-04 DIAGNOSIS — R9431 Abnormal electrocardiogram [ECG] [EKG]: Secondary | ICD-10-CM | POA: Diagnosis not present

## 2021-05-04 LAB — ECHOCARDIOGRAM COMPLETE
AR max vel: 3.05 cm2
AV Area VTI: 3.12 cm2
AV Area mean vel: 2.69 cm2
AV Mean grad: 4 mmHg
AV Peak grad: 8 mmHg
Ao pk vel: 1.41 m/s
Area-P 1/2: 4.08 cm2
Calc EF: 54 %
S' Lateral: 3.1 cm
Single Plane A2C EF: 54 %
Single Plane A4C EF: 54.5 %

## 2021-05-04 NOTE — Progress Notes (Signed)
Cardiology Office Note:    Date:  05/05/2021   ID:  Martin Cole, DOB 10/20/1961, MRN [id number]  PCP:  Martin Nam, MD  [facility] Cardiologist:  None  [facility] Electrophysiologist:  None   Referring MD: Martin Nam, MD   Chief Complaint: 1 month follow-up  History of Present Illness:    Martin Cole is a 60 y.o. male with a hx of HTN, HLD, migraine headaches, GERD, anxiety, prior tobacco use (30 years), COVID in 2020. He reported normal stress test 15-20 years ago. He is undergoing rheumatologic work-up for joint pain.  He was seen 03/16/21 and reported shortness of breath with exertion. Echo and stress test were ordered.  Echo showed LVEF 60-65%, G2DD, normal RV function, moderately dilated LA, mild MR. Stress test was normal, overall low risk, LVEF 55-65%, CT attenuation showed mild aortic calcifications but no significant coronary calcifications.   Today, imaging studies were reviewed in detail. The patient reports he has been doing well since the last visit. Still gets SOB when he is doing vigorous work. Has not gone out hunting since it's not deer season. But he is able to do moderate to heavy work around the house. He does no formal exercise, has been about 20 years since doing any formal exercise. Diet is generally home-cooked, trying to eat relatively healthy. No chest pain, palpitations, lower leg edema, orthopnea, pnd.   Past Medical History:  Diagnosis Date  . Anxiety   . Arthritis    knees, shoulders  . COVID-19 09/08/2019  . GERD (gastroesophageal reflux disease)   . Headache(784.0)   . History of ETT 1999   wnl  . Hyperlipidemia   . Hypertension   . Lip swelling    With hives 2017. No clear trigger.  . Migraine aura without headache    better since started Paxil    Past Surgical History:  Procedure Laterality Date  . CATARACT EXTRACTION W/PHACO Left 04/27/2020   Procedure: CATARACT EXTRACTION PHACO AND INTRAOCULAR LENS PLACEMENT (IOC)  LEFT 6.86  00:57.6;  Surgeon: Martin Manila, MD;  Location[facility];  Service: Ophthalmology;  Laterality: Left;  . COLONOSCOPY    . ESOPHAGOGASTRODUODENOSCOPY  10/18/98   esoph stricture, esophagitis, duodenitis  . ESOPHAGOGASTRODUODENOSCOPY  12/99   esoph dilation  . KNEE ARTHROSCOPY  1999   left knee  . KNEE ARTHROSCOPY  12/03   right  (Martin Cole)  . SKIN CANCER EXCISION  1983  . TOE SURGERY Right    2015  . TONSILLECTOMY  1968    Current Medications: Current Meds  Medication Sig  . aspirin 81 MG tablet Take 81 mg by mouth daily.  . bisoprolol-hydrochlorothiazide (ZIAC) 5-6.25 MG tablet Take 2 tablets by mouth once daily  . EPINEPHrine (EPIPEN 2-PAK) 0.3 mg/0.3 mL IJ SOAJ injection Inject 0.3 mLs (0.3 mg total) into the muscle as needed for anaphylaxis.  Marland Kitchen MOBIC 15 MG tablet Take 1 tablet by mouth daily as needed.  Marland Kitchen omeprazole (PRILOSEC OTC) 20 MG tablet Take 1 tablet (20 mg total) by mouth as needed.  Marland Kitchen PARoxetine (PAXIL) 20 MG tablet Take 1 tablet (20 mg total) by mouth every morning.  . simvastatin (ZOCOR) 40 MG tablet Take 1 tablet (40 mg total) by mouth at bedtime.  . verapamil (CALAN-SR) 120 MG CR tablet Take 2 tablets (240 mg total) by mouth daily.     Allergies:   Ace inhibitors, Angiotensin receptor blockers, and Lipitor [atorvastatin]   Social History  Socioeconomic History  . Marital status: Married    Spouse name: Not on file  . Number of children: 1  . Years of education: Not on file  . Highest education level: Not on file  Occupational History  . Occupation: Academic librarian: [employer]  Tobacco Use  . Smoking status: Former Smoker    Packs/day: 1.00    Years: 19.00    Pack years: 19.00    Quit date: 1997    Years since quitting: 25.4  . Smokeless tobacco: Current User    Types: Chew  Vaping Use  . Vaping Use: Never used  Substance and Sexual Activity  . Alcohol use: No    Comment: rare  occassions  . Drug use: No  . Sexual activity: Yes  Other Topics Concern  . Not on file  Social History Narrative   Married 40+ years   Works a Media planner grad   Social Determinants of Corporate investment banker Strain: Not on file  Food Insecurity: Not on file  Transportation Needs: Not on file  Physical Activity: Not on file  Stress: Not on file  Social Connections: Not on file     Family History: The patient's family history includes Alcohol abuse in his father; COPD in his father; Cancer in his father and sister; Coronary artery disease (age of onset: 7) in his sister; Coronary artery disease (age of onset: 77) in his mother; Dementia in his mother; Depression in his mother; Heart disease in his sister; Hyperlipidemia in his mother; Hypertension in his mother; Prostate cancer in his maternal uncle. There is no history of Colon cancer, Esophageal cancer, or Rectal cancer.  ROS:   Please see the history of present illness.     All other systems reviewed and are negative.  EKGs/Labs/Other Studies Reviewed:    The following studies were reviewed today:  Echo 05/04/21 1. Left ventricular ejection fraction, by estimation, is 60 to 65%. The  left ventricle has normal function. The left ventricle has no regional  wall motion abnormalities. Left ventricular diastolic parameters are  consistent with Grade II diastolic  dysfunction (pseudonormalization).  2. Right ventricular systolic function is normal. The right ventricular  size is normal. Tricuspid regurgitation signal is inadequate for assessing  PA pressure.  3. Left atrial size was moderately dilated.  4. The mitral valve is normal in structure. Mild mitral valve  regurgitation.   Myoview lexiscan 03/21/21  There was no ST segment deviation noted during stress.  No T wave inversion was noted during stress.  The study is normal.  This is a low risk study.  The left ventricular ejection fraction  is normal (55-65%).  CT attenuation images shows mild aortic calcifications but no significant coronary calcifications.   EKG:  EKG is not ordered today.    Recent Labs: 02/10/2021: ALT 17 02/18/2021: BUN 17; Creatinine, Ser 1.29; Hemoglobin 14.2; Platelets 180.0; Potassium 4.3; Pro B Natriuretic peptide (BNP) 59.0; Sodium 139; TSH 2.22  Recent Lipid Panel    Component Value Date/Time   CHOL 144 02/10/2021 0730   TRIG 314.0 (H) 02/10/2021 0730   HDL 24.40 (L) 02/10/2021 0730   CHOLHDL 6 02/10/2021 0730   VLDL 62.8 (H) 02/10/2021 0730   LDLCALC 96 12/23/2013 0908   LDLDIRECT 65.0 02/10/2021 0730    Physical Exam:    VS:  BP 110/60 (BP Location: Left Arm, Patient Position: Sitting, Cuff Size: Normal)   Pulse Marland Kitchen)  58   Ht 6' (1.829 m)   Wt 191 lb (86.6 kg)   SpO2 98%   BMI 25.90 kg/m     Wt Readings from Last 3 Encounters:  05/05/21 191 lb (86.6 kg)  03/16/21 190 lb (86.2 kg)  02/15/21 197 lb (89.4 kg)     GEN:  Well nourished, well developed in no acute distress HEENT: Normal NECK: No JVD; No carotid bruits LYMPHATICS: No lymphadenopathy CARDIAC: RRR, no murmurs, rubs, gallops RESPIRATORY:  Clear to auscultation without rales, wheezing or rhonchi  ABDOMEN: Soft, non-tender, non-distended MUSCULOSKELETAL:  No edema; No deformity  SKIN: Warm and dry NEUROLOGIC:  Alert and oriented x 3 PSYCHIATRIC:  Normal affect   ASSESSMENT:    1. Dyspnea on exertion   2. Essential hypertension   3. Mixed hyperlipidemia   4. Chronic diastolic heart failure (HCC)    PLAN:    In order of problems listed above:  Dyspnea on Exertion Shortness of breath only with vigorous exertion. No chest pain, LLE, orthopnea, pnd. Echo showed normal LVEF with G2DD and mild MR. Stress test low risk, normal, with no significant coronary artery calcifications, which is overall reassuring. Patient has not done formal exercise in 20 years. I encouraged finding possible exercise regimen. Further  lifestyle changes discussed.  He is a prior smoker, quit 30 years ago. Continue secondary prevention with Aspirin, statin, and BB. Can follow-up in a year, or sooner, if symptoms change.  HTN BP well controlled. Continue Bisoprolol/HCTZ, verapamil.  HLD Direct LDL 65 in 02/2021. Continue simvastatin.  HFpEF Mild MR Euvolemic on exam. CHF education given, acute CHF symptoms discussed in detail. Continue BB. He is on HCTZ as well.    Disposition: Follow up in 1 year(s) with MD   Signed, Asia Favata David Stall, PA-C  05/05/2021 8:31 AM    [facility]

## 2021-05-05 ENCOUNTER — Ambulatory Visit (INDEPENDENT_AMBULATORY_CARE_PROVIDER_SITE_OTHER): Payer: BC Managed Care – PPO | Admitting: Medical

## 2021-05-05 ENCOUNTER — Encounter: Payer: Self-pay | Admitting: Medical

## 2021-05-05 VITALS — BP 110/60 | HR 58 | Ht 72.0 in | Wt 191.0 lb

## 2021-05-05 DIAGNOSIS — R0600 Dyspnea, unspecified: Secondary | ICD-10-CM | POA: Diagnosis not present

## 2021-05-05 DIAGNOSIS — I10 Essential (primary) hypertension: Secondary | ICD-10-CM | POA: Diagnosis not present

## 2021-05-05 DIAGNOSIS — I5032 Chronic diastolic (congestive) heart failure: Secondary | ICD-10-CM

## 2021-05-05 DIAGNOSIS — E782 Mixed hyperlipidemia: Secondary | ICD-10-CM

## 2021-05-05 DIAGNOSIS — R0609 Other forms of dyspnea: Secondary | ICD-10-CM

## 2021-05-05 NOTE — Patient Instructions (Signed)
Medication Instructions:   Your physician recommends that you continue on your current medications as directed. Please refer to the Current Medication list given to you today.   *If you need a refill on your cardiac medications before your next appointment, please call your pharmacy*   Lab Work: None ordered If you have labs (blood work) drawn today and your tests are completely normal, you will receive your results only by: Marland Kitchen MyChart Message (if you have MyChart) OR . A paper copy in the mail If you have any lab test that is abnormal or we need to change your treatment, we will call you to review the results.   Testing/Procedures: None ordered   Follow-Up: At [facility], you and your health needs are our priority.  As part of our continuing mission to provide you with exceptional heart care, we have created designated Provider Care Teams.  These Care Teams include your primary Cardiologist (physician) and Advanced Practice Providers (APPs -  Physician Assistants and Nurse Practitioners) who all work together to provide you with the care you need, when you need it.  We recommend signing up for the patient portal called "MyChart".  Sign up information is provided on this After Visit Summary.  MyChart is used to connect with patients for Virtual Visits (Telemedicine).  Patients are able to view lab/test results, encounter notes, upcoming appointments, etc.  Non-urgent messages can be sent to your provider as well.   To learn more about what you can do with MyChart, go to [email or web address].    Your next appointment:   6 week(s)  The format for your next appointment:   In Person  Provider:   You may see Dr. Okey Dupre or one of the following Advanced Practice Providers on your designated Care Team:    Nicolasa Ducking, NP  Eula Listen, PA-C  Marisue Ivan, PA-C  Cadence [place], [place]  Gillian Shields, NP    Other Instructions

## 2021-05-12 DIAGNOSIS — G8322 Monoplegia of upper limb affecting left dominant side: Secondary | ICD-10-CM | POA: Diagnosis not present

## 2021-05-12 DIAGNOSIS — G8321 Monoplegia of upper limb affecting right dominant side: Secondary | ICD-10-CM | POA: Diagnosis not present

## 2021-05-18 ENCOUNTER — Encounter: Payer: Self-pay | Admitting: Neurology

## 2021-05-29 ENCOUNTER — Other Ambulatory Visit: Payer: Self-pay | Admitting: Family Medicine

## 2021-06-21 ENCOUNTER — Other Ambulatory Visit: Payer: Self-pay

## 2021-06-21 DIAGNOSIS — R202 Paresthesia of skin: Secondary | ICD-10-CM

## 2021-06-22 ENCOUNTER — Ambulatory Visit (INDEPENDENT_AMBULATORY_CARE_PROVIDER_SITE_OTHER): Payer: BC Managed Care – PPO | Admitting: Neurology

## 2021-06-22 ENCOUNTER — Other Ambulatory Visit: Payer: Self-pay

## 2021-06-22 DIAGNOSIS — G5623 Lesion of ulnar nerve, bilateral upper limbs: Secondary | ICD-10-CM

## 2021-06-22 DIAGNOSIS — R202 Paresthesia of skin: Secondary | ICD-10-CM | POA: Diagnosis not present

## 2021-06-22 DIAGNOSIS — G5603 Carpal tunnel syndrome, bilateral upper limbs: Secondary | ICD-10-CM

## 2021-06-22 NOTE — Procedures (Signed)
[facility]  [address] [place], [address]  [place], [place] [address] Tel: [phone] Fax:  [phone] Test Date:  06/22/2021  Patient: Nachmen, Mansel. DOB: January 20, 1961 Physician: Nita Sickle, DO  Sex: Male Height: 6' " Ref Phys: Pati Gallo, M.D.  ID#: [id number]   Technician:    Patient Complaints: This is a 60 year old man referred for evaluation of bilateral hand numbness and tingling.  NCV & EMG Findings: Extensive electrodiagnostic testing of the right upper extremity and additional studies of the left shows:  Right median sensory response is absent.  Left median sensory response shows prolonged latency (7.2 ms) and reduced amplitude (5.2 V).  Bilateral ulnar sensory responses show prolonged latency (R3.7, L3.4 ms).  Bilateral radial sensory responses are within normal limits. Right median motor response shows severely prolonged latency (R8.3 ms) and reduced amplitude (R4.6 mV).  Left median motor response shows prolonged latency (4.1 ms).  Bilateral ulnar motor responses show slowed conduction velocity across the elbow (A Elbow-B Elbow, R38, L34 m/s).   Chronic motor axonal loss changes are seen affecting the right ulnar innervated muscles and bilateral abductor pollicis brevis muscles.  There is no evidence of accompanied active denervation   Impression: Right median neuropathy at or distal to the wrist (severe), consistent with a clinical diagnosis of carpal tunnel syndrome.   Left median neuropathy at or distal to the wrist (moderate), consistent with a clinical diagnosis of carpal tunnel syndrome.   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   ___________________________ Nita Sickle, DO    Nerve Conduction Studies Anti Sensory Summary Table   Stim Site NR Peak (ms) Norm Peak (ms) P-T Amp (V) Norm P-T Amp  Left Median Anti Sensory (2nd Digit)  32C  Wrist    7.2 <3.8 5.2 >10  Right Median Anti  Sensory (2nd Digit)  32C  Wrist NR  <3.8  >10  Left Radial Anti Sensory (Base 1st Digit)  32C  Wrist    2.5 <2.8 23.3 >10  Right Radial Anti Sensory (Base 1st Digit)  32C  Wrist    2.3 <2.8 26.8 >10  Left Ulnar Anti Sensory (5th Digit)  32C  Wrist    3.4 <3.2 10.5 >5  Right Ulnar Anti Sensory (5th Digit)  32C  Wrist    3.7 <3.2 12.7 >5   Motor Summary Table   Stim Site NR Onset (ms) Norm Onset (ms) O-P Amp (mV) Norm O-P Amp Site1 Site2 Delta-0 (ms) Dist (cm) Vel (m/s) Norm Vel (m/s)  Left Median Motor (Abd Poll Brev)  32C  Wrist    4.1 <4.0 7.3 >5 Elbow Wrist 5.2 26.0 50 >50  Elbow    9.3  6.7         Right Median Motor (Abd Poll Brev)  32C  Wrist    8.3 <4.0 4.6 >5 Elbow Wrist 6.0 31.0 52 >50  Elbow    14.3  4.5         Left Ulnar Motor (Abd Dig Minimi)  32C  Wrist    2.8 <3.1 10.8 >7 B Elbow Wrist 4.1 23.0 56 >50  B Elbow    6.9  9.1  A Elbow B Elbow 2.9 10.0 34 >50  A Elbow    9.8  8.7         Right Ulnar Motor (Abd Dig Minimi)  32C  Wrist    2.6 <3.1 10.7 >7 B Elbow Wrist 4.3 23.0 53 >50  B Elbow  6.9  9.9  A Elbow B Elbow 2.6 10.0 38 >50  A Elbow    9.5  9.1          EMG   Side Muscle Ins Act Fibs Psw Fasc Number Recrt Dur Dur. Amp Amp. Poly Poly. Comment  Right 1stDorInt Nml Nml Nml Nml 1- Rapid Some 1+ Some 1+ Some 1+ N/A  Right Abd Poll Brev Nml Nml Nml Nml 3- Rapid Most 1+ Most 1+ Most 1+ N/A  Right PronatorTeres Nml Nml Nml Nml Nml Nml Nml Nml Nml Nml Nml Nml N/A  Right Biceps Nml Nml Nml Nml Nml Nml Nml Nml Nml Nml Nml Nml N/A  Right Triceps Nml Nml Nml Nml Nml Nml Nml Nml Nml Nml Nml Nml N/A  Right Deltoid Nml Nml Nml Nml Nml Nml Nml Nml Nml Nml Nml Nml N/A  Right FlexDigProf 4,5 Nml Nml Nml Nml 1- Rapid Some 1+ Some 1+ Some 1+ N/A  Left 1stDorInt Nml Nml Nml Nml Nml Nml Nml Nml Nml Nml Nml Nml N/A  Left Abd Poll Brev Nml Nml Nml Nml 1- Rapid Few 1+ Few 1+ Few 1+ N/A  Left PronatorTeres Nml Nml Nml Nml Nml Nml Nml Nml Nml Nml Nml Nml N/A  Left Biceps  Nml Nml Nml Nml Nml Nml Nml Nml Nml Nml Nml Nml N/A  Left Triceps Nml Nml Nml Nml Nml Nml Nml Nml Nml Nml Nml Nml N/A  Left Deltoid Nml Nml Nml Nml Nml Nml Nml Nml Nml Nml Nml Nml N/A  Left FlexDigProf 4,5 Nml Nml Nml Nml Nml Nml Nml Nml Nml Nml Nml Nml N/A      Waveforms:

## 2021-07-21 DIAGNOSIS — G5601 Carpal tunnel syndrome, right upper limb: Secondary | ICD-10-CM | POA: Diagnosis not present

## 2021-09-26 ENCOUNTER — Other Ambulatory Visit: Payer: Self-pay | Admitting: Family Medicine

## 2021-11-06 ENCOUNTER — Other Ambulatory Visit: Payer: Self-pay | Admitting: Family Medicine

## 2021-11-07 ENCOUNTER — Encounter: Payer: Self-pay | Admitting: Family Medicine

## 2021-11-07 DIAGNOSIS — G5601 Carpal tunnel syndrome, right upper limb: Secondary | ICD-10-CM | POA: Diagnosis not present

## 2021-11-07 DIAGNOSIS — G5621 Lesion of ulnar nerve, right upper limb: Secondary | ICD-10-CM | POA: Diagnosis not present

## 2021-11-07 NOTE — Telephone Encounter (Signed)
Called patient and left vm and sent message via mychart

## 2021-11-07 NOTE — Telephone Encounter (Signed)
Please call and schedule CPE after 02/15/22

## 2021-11-15 DIAGNOSIS — G5601 Carpal tunnel syndrome, right upper limb: Secondary | ICD-10-CM | POA: Diagnosis not present

## 2021-11-15 DIAGNOSIS — M25631 Stiffness of right wrist, not elsewhere classified: Secondary | ICD-10-CM | POA: Diagnosis not present

## 2021-11-15 DIAGNOSIS — G5603 Carpal tunnel syndrome, bilateral upper limbs: Secondary | ICD-10-CM | POA: Diagnosis not present

## 2021-11-15 DIAGNOSIS — M6281 Muscle weakness (generalized): Secondary | ICD-10-CM | POA: Diagnosis not present

## 2021-11-29 DIAGNOSIS — G5603 Carpal tunnel syndrome, bilateral upper limbs: Secondary | ICD-10-CM | POA: Diagnosis not present

## 2021-12-02 DIAGNOSIS — G5622 Lesion of ulnar nerve, left upper limb: Secondary | ICD-10-CM | POA: Diagnosis not present

## 2021-12-02 DIAGNOSIS — G5602 Carpal tunnel syndrome, left upper limb: Secondary | ICD-10-CM | POA: Diagnosis not present

## 2021-12-18 ENCOUNTER — Other Ambulatory Visit: Payer: Self-pay | Admitting: Family Medicine

## 2021-12-18 DIAGNOSIS — E7800 Pure hypercholesterolemia, unspecified: Secondary | ICD-10-CM

## 2021-12-18 DIAGNOSIS — Z125 Encounter for screening for malignant neoplasm of prostate: Secondary | ICD-10-CM

## 2021-12-29 ENCOUNTER — Other Ambulatory Visit: Payer: BC Managed Care – PPO

## 2021-12-30 ENCOUNTER — Other Ambulatory Visit: Payer: Self-pay | Admitting: Family Medicine

## 2022-01-05 ENCOUNTER — Encounter: Payer: BC Managed Care – PPO | Admitting: Family Medicine

## 2022-01-26 ENCOUNTER — Other Ambulatory Visit: Payer: BC Managed Care – PPO

## 2022-01-30 ENCOUNTER — Other Ambulatory Visit (INDEPENDENT_AMBULATORY_CARE_PROVIDER_SITE_OTHER): Payer: BC Managed Care – PPO

## 2022-01-30 ENCOUNTER — Other Ambulatory Visit: Payer: Self-pay

## 2022-01-30 DIAGNOSIS — E7800 Pure hypercholesterolemia, unspecified: Secondary | ICD-10-CM | POA: Diagnosis not present

## 2022-01-30 DIAGNOSIS — Z125 Encounter for screening for malignant neoplasm of prostate: Secondary | ICD-10-CM | POA: Diagnosis not present

## 2022-01-30 LAB — COMPREHENSIVE METABOLIC PANEL
ALT: 16 U/L (ref 0–53)
AST: 15 U/L (ref 0–37)
Albumin: 4.4 g/dL (ref 3.5–5.2)
Alkaline Phosphatase: 68 U/L (ref 39–117)
BUN: 16 mg/dL (ref 6–23)
CO2: 31 mEq/L (ref 19–32)
Calcium: 9.5 mg/dL (ref 8.4–10.5)
Chloride: 102 mEq/L (ref 96–112)
Creatinine, Ser: 1.45 mg/dL (ref 0.40–1.50)
GFR: 51.46 mL/min — ABNORMAL LOW (ref >60.00)
Glucose, Bld: 95 mg/dL (ref 70–99)
Potassium: 3.8 mEq/L (ref 3.5–5.1)
Sodium: 140 mEq/L (ref 135–145)
Total Bilirubin: 0.6 mg/dL (ref 0.2–1.2)
Total Protein: 6.3 g/dL (ref 6.0–8.3)

## 2022-01-30 LAB — LIPID PANEL
Cholesterol: 156 mg/dL (ref 0–200)
HDL: 24.2 mg/dL — ABNORMAL LOW (ref >39.00)
NonHDL: 131.94
Total CHOL/HDL Ratio: 6
Triglycerides: 337 mg/dL — ABNORMAL HIGH (ref 0.0–149.0)
VLDL: 67.4 mg/dL — ABNORMAL HIGH (ref 0.0–40.0)

## 2022-01-30 LAB — LDL CHOLESTEROL, DIRECT: Direct LDL: 69 mg/dL

## 2022-01-31 LAB — PSA: PSA: 0.61 ng/mL (ref 0.10–4.00)

## 2022-02-06 ENCOUNTER — Ambulatory Visit (INDEPENDENT_AMBULATORY_CARE_PROVIDER_SITE_OTHER): Payer: BC Managed Care – PPO | Admitting: Family Medicine

## 2022-02-06 ENCOUNTER — Encounter: Payer: Self-pay | Admitting: Family Medicine

## 2022-02-06 ENCOUNTER — Other Ambulatory Visit: Payer: Self-pay

## 2022-02-06 VITALS — BP 118/62 | HR 60 | Temp 97.5°F | Ht 72.0 in | Wt 200.0 lb

## 2022-02-06 DIAGNOSIS — Z0000 Encounter for general adult medical examination without abnormal findings: Secondary | ICD-10-CM | POA: Diagnosis not present

## 2022-02-06 DIAGNOSIS — G43109 Migraine with aura, not intractable, without status migrainosus: Secondary | ICD-10-CM

## 2022-02-06 DIAGNOSIS — E7800 Pure hypercholesterolemia, unspecified: Secondary | ICD-10-CM

## 2022-02-06 DIAGNOSIS — I10 Essential (primary) hypertension: Secondary | ICD-10-CM

## 2022-02-06 DIAGNOSIS — Z7189 Other specified counseling: Secondary | ICD-10-CM

## 2022-02-06 MED ORDER — PAROXETINE HCL 20 MG PO TABS: 20.0000 mg | ORAL_TABLET | ORAL | 3 refills | Status: DC

## 2022-02-06 MED ORDER — EPINEPHRINE 0.3 MG/0.3ML IJ SOAJ: 0.3000 mg | INTRAMUSCULAR | 1 refills | Status: AC | PRN

## 2022-02-06 MED ORDER — SIMVASTATIN 40 MG PO TABS: 40.0000 mg | ORAL_TABLET | Freq: Every day | ORAL | 3 refills | Status: DC

## 2022-02-06 MED ORDER — VERAPAMIL HCL ER 120 MG PO TBCR: 240.0000 mg | EXTENDED_RELEASE_TABLET | Freq: Every day | ORAL | 3 refills | Status: DC

## 2022-02-06 MED ORDER — BISOPROLOL-HYDROCHLOROTHIAZIDE 5-6.25 MG PO TABS: 2.0000 | ORAL_TABLET | Freq: Every day | ORAL | 3 refills | Status: DC

## 2022-02-06 NOTE — Progress Notes (Signed)
This visit occurred during the SARS-CoV-2 public health emergency.  Safety protocols were in place, including screening questions prior to the visit, additional usage of staff PPE, and extensive cleaning of exam room while observing appropriate contact time as indicated for disinfecting solutions. ? ?CPE- See plan.  Routine anticipatory guidance given to patient.  See health maintenance.  The possibility exists that previously documented standard health maintenance information may have been brought forward from a previous encounter into this note.  If needed, that same information has been updated to reflect the current situation based on today's encounter.   ? ?Tetanus 2016 ?Flu 2022 ?PNA not due yet. ?Shingles d/w pt.  had 1st dose.   ?covid vaccine prev done ?PSA wnl, d/w pt.   ?Colonoscopy 2020 ?Diet and exercise d/w pt.  Encouraged both.  ?Living will d/w pt.  Wife would be designated if patient were incapacitated.   ?HIV and HCV prev neg.   ?Discussed quitting chewing tobacco, encouraged.  ? ?Elevated Cholesterol: ?Using medications without problems:yes ?Muscle aches: likely from OA and not from statin.  ?Diet compliance: d/w pt.  ?Exercise: walking for exercise.   ?D/w pt about diet and exercise.   ? ?Migraines improved with paxil.  Sx better on med.  No ADE on med.  Reasonable to continue as is.  Discussed. ? ?Hypertension:    ?Using medication without problems or lightheadedness: yes ?Chest pain with exertion:no ?Edema:no ?Short of breath: no ?Labs d/w pt.   ?D/w pt about baseline Cr, not sig different from prev.  See avs.   ? ?PMH and SH reviewed ? ?Meds, vitals, and allergies reviewed.  ? ?ROS: Per HPI.  Unless specifically indicated otherwise in HPI, the patient denies: ? ?General: fever. ?Eyes: acute vision changes ?ENT: sore throat ?Cardiovascular: chest pain ?Respiratory: SOB ?GI: vomiting ?GU: dysuria ?Musculoskeletal: acute back pain ?Derm: acute rash ?Neuro: acute motor dysfunction ?Psych:  worsening mood ?Endocrine: polydipsia ?Heme: bleeding ?Allergy: hayfever ? ?GEN: nad, alert and oriented ?HEENT: ncat ?NECK: supple w/o LA ?CV: rrr. ?PULM: ctab, no inc wob ?ABD: soft, +bs ?EXT: no edema ?SKIN: Well-perfused. ?

## 2022-02-06 NOTE — Patient Instructions (Addendum)
Schedule a nonfasting lab visit when possible.  Drink plenty of water that day prior to labs.   ?Update me as needed.  ?Take care.  Glad to see you. ?

## 2022-02-08 NOTE — Assessment & Plan Note (Signed)
Continue verapamil and bisoprolol hydrochlorothiazide.  Labs discussed with patient.  We can recheck creatinine when he is well-hydrated. ?

## 2022-02-08 NOTE — Assessment & Plan Note (Signed)
Migraines improved with paxil.  Sx better on med.  No ADE on med.  Reasonable to continue as is.  Discussed. 

## 2022-02-08 NOTE — Assessment & Plan Note (Signed)
  Living will d/w pt.  Wife would be designated if patient were incapacitated.   

## 2022-02-08 NOTE — Assessment & Plan Note (Signed)
Continue simvastatin.  Continue work on diet and exercise. 

## 2022-02-08 NOTE — Assessment & Plan Note (Signed)
Tetanus 2016 ?Flu 2022 ?PNA not due yet. ?Shingles d/w pt.  had 1st dose.   ?covid vaccine prev done ?PSA wnl, d/w pt.   ?Colonoscopy 2020 ?Diet and exercise d/w pt.  Encouraged both.  ?Living will d/w pt.  Wife would be designated if patient were incapacitated.   ?HIV and HCV prev neg.   ?Discussed quitting chewing tobacco, encouraged.  ?

## 2022-03-03 ENCOUNTER — Other Ambulatory Visit: Payer: BC Managed Care – PPO

## 2022-03-06 ENCOUNTER — Other Ambulatory Visit (INDEPENDENT_AMBULATORY_CARE_PROVIDER_SITE_OTHER): Payer: BC Managed Care – PPO

## 2022-03-06 DIAGNOSIS — I10 Essential (primary) hypertension: Secondary | ICD-10-CM | POA: Diagnosis not present

## 2022-03-06 LAB — BASIC METABOLIC PANEL
BUN: 18 mg/dL (ref 6–23)
CO2: 31 mEq/L (ref 19–32)
Calcium: 9.8 mg/dL (ref 8.4–10.5)
Chloride: 98 mEq/L (ref 96–112)
Creatinine, Ser: 1.4 mg/dL (ref 0.40–1.50)
GFR: 53.63 mL/min — ABNORMAL LOW (ref >60.00)
Glucose, Bld: 95 mg/dL (ref 70–99)
Potassium: 4.1 mEq/L (ref 3.5–5.1)
Sodium: 137 mEq/L (ref 135–145)

## 2022-05-21 NOTE — Progress Notes (Unsigned)
Cardiology Office Note:    Date:  05/22/2022   ID:  Martin Cole, DOB 28-Dec-1960, MRN [id number]  PCP:  Martin Nam, MD  [facility] Cardiologist:  Martin Kendall, MD  [facility] Electrophysiologist:  None   Referring MD: Martin Nam, MD   Chief Complaint: 12 month follow-up  History of Present Illness:    Martin Cole is a 61 y.o. male with a hx of with a hx of HTN, HLD, migraine headaches, GERD, anxiety, prior tobacco use (30 years), COVID in 2020. He reported normal stress test 15-20 years ago. He is undergoing rheumatologic work-up for joint pain.   He was seen 03/16/21 and reported shortness of breath with exertion. Echo and stress test were ordered. Echo showed LVEF 60-65%, G2DD, normal RV function, moderately dilated LA, mild MR. Stress test was normal, overall low risk, LVEF 55-65%, CT attenuation showed mild aortic calcifications but no significant coronary calcifications.   Last seen 05/05/21 reporting same DOE.   Today, the patient reports he is doing about the same as last year. He walks 1.7 miles daily. Has some chronic fatigue. It comes and goes. Breathing is OK. No chest pain, lower leg edema, orthopnea, pnd, palpitations. Has occasional lightheadedness upon standing, very rare. He eats home-cooked meals. HE sees PCP regularly, he had labs earlier this year, TG were high and he has joint pain on simvastatin.   Past Medical History:  Diagnosis Date   Arthritis    knees, shoulders   COVID-19 09/08/2019   GERD (gastroesophageal reflux disease)    Headache(784.0)    History of ETT 1999   wnl   Hyperlipidemia    Hypertension    Lip swelling    With hives 2017. No clear trigger.   Migraine aura without headache    better since started Paxil    Past Surgical History:  Procedure Laterality Date   CARPAL TUNNEL RELEASE Bilateral    CATARACT EXTRACTION W/PHACO Left 04/27/2020   Procedure: CATARACT EXTRACTION PHACO AND INTRAOCULAR LENS PLACEMENT  (IOC) LEFT 6.86  00:57.6;  Surgeon: Martin Manila, MD;  Location: [facility];  Service: Ophthalmology;  Laterality: Left;   COLONOSCOPY     ESOPHAGOGASTRODUODENOSCOPY  10/18/1998   esoph stricture, esophagitis, duodenitis   ESOPHAGOGASTRODUODENOSCOPY  11/1998   esoph dilation   KNEE ARTHROSCOPY  1999   left knee   KNEE ARTHROSCOPY  11/2002   right  (Dr. Chaney Cole)   SKIN CANCER EXCISION  1983   TOE SURGERY Right    2015   TONSILLECTOMY  1968    Current Medications: Current Meds  Medication Sig   aspirin 81 MG tablet Take 81 mg by mouth daily.   bisoprolol-hydrochlorothiazide (ZIAC) 5-6.25 MG tablet Take 2 tablets by mouth daily.   EPINEPHrine (EPIPEN 2-PAK) 0.3 mg/0.3 mL IJ SOAJ injection Inject 0.3 mg into the muscle as needed for anaphylaxis (okay to dispense epinephrine autoinjector).   omeprazole (PRILOSEC OTC) 20 MG tablet Take 1 tablet (20 mg total) by mouth as needed.   PARoxetine (PAXIL) 20 MG tablet Take 1 tablet (20 mg total) by mouth every morning.   simvastatin (ZOCOR) 40 MG tablet Take 1 tablet (40 mg total) by mouth at bedtime.   verapamil (CALAN-SR) 120 MG CR tablet Take 2 tablets (240 mg total) by mouth daily.     Allergies:   Ace inhibitors, Angiotensin receptor blockers, and Lipitor [atorvastatin]   Social History   Socioeconomic History   Marital status: Married  Spouse name: Not on file   Number of children: 1   Years of education: Not on file   Highest education level: Not on file  Occupational History   Occupation: Camera operator specialist    Employer: [employer]  Tobacco Use   Smoking status: Former    Packs/day: 1.00    Years: 19.00    Total pack years: 19.00    Types: Cigarettes    Quit date: 1997    Years since quitting: 26.4   Smokeless tobacco: Current    Types: Chew  Vaping Use   Vaping Use: Never used  Substance and Sexual Activity   Alcohol use: No    Comment: rare occassions   Drug use: No   Sexual  activity: Yes  Other Topics Concern   Not on file  Social History Narrative   Married 40+ years   Works a Media planner grad   Social Determinants of Corporate investment banker Strain: Not on file  Food Insecurity: Not on file  Transportation Needs: Not on file  Physical Activity: Not on file  Stress: Not on file  Social Connections: Not on file     Family History: The patient's family history includes Alcohol abuse in his father; COPD in his father; Cancer in his father and sister; Coronary artery disease (age of onset: 81) in his sister; Coronary artery disease (age of onset: 74) in his mother; Dementia in his mother; Depression in his mother; Heart disease in his sister; Hyperlipidemia in his mother; Hypertension in his mother; Prostate cancer in his maternal uncle. There is no history of Colon cancer, Esophageal cancer, or Rectal cancer.  ROS:   Please see the history of present illness.     All other systems reviewed and are negative.  EKGs/Labs/Other Studies Reviewed:    The following studies were reviewed today:  Echo 05/2021 1. Left ventricular ejection fraction, by estimation, is 60 to 65%. The  left ventricle has normal function. The left ventricle has no regional  wall motion abnormalities. Left ventricular diastolic parameters are  consistent with Grade II diastolic  dysfunction (pseudonormalization).   2. Right ventricular systolic function is normal. The right ventricular  size is normal. Tricuspid regurgitation signal is inadequate for assessing  PA pressure.   3. Left atrial size was moderately dilated.   4. The mitral valve is normal in structure. Mild mitral valve  regurgitation.   Myoview Lexiscan 03/2021 Narrative & Impression  There was no ST segment deviation noted during stress. No T wave inversion was noted during stress. The study is normal. This is a low risk study. The left ventricular ejection fraction is normal (55-65%). CT  attenuation images shows mild aortic calcifications but no significant coronary calcifications.     EKG:  EKG is  ordered today.  The ekg ordered today demonstrates SB, 57bpm, RBBB, nonspecific T Wave changes  Recent Labs: 01/30/2022: ALT 16 03/06/2022: BUN 18; Creatinine, Ser 1.40; Potassium 4.1; Sodium 137  Recent Lipid Panel    Component Value Date/Time   CHOL 156 01/30/2022 0735   TRIG 337.0 (H) 01/30/2022 0735   HDL 24.20 (L) 01/30/2022 0735   CHOLHDL 6 01/30/2022 0735   VLDL 67.4 (H) 01/30/2022 0735   LDLCALC 96 12/23/2013 0908   LDLDIRECT 69.0 01/30/2022 0735     Physical Exam:    VS:  BP 116/80 (BP Location: Left Arm, Patient Position: Sitting, Cuff Size: Large)   Pulse (!) 57  Ht 6' (1.829 m)   Wt 194 lb (88 kg)   SpO2 97%   BMI 26.31 kg/m     Wt Readings from Last 3 Encounters:  05/22/22 194 lb (88 kg)  02/06/22 200 lb (90.7 kg)  05/05/21 191 lb (86.6 kg)     GEN:  Well nourished, well developed in no acute distress HEENT: Normal NECK: No JVD; No carotid bruits LYMPHATICS: No lymphadenopathy CARDIAC:  sinus bradycardia, RR, no murmurs, rubs, gallops RESPIRATORY:  Clear to auscultation without rales, wheezing or rhonchi  ABDOMEN: Soft, non-tender, non-distended MUSCULOSKELETAL:  No edema; No deformity  SKIN: Warm and dry NEUROLOGIC:  Alert and oriented x 3 PSYCHIATRIC:  Normal affect   ASSESSMENT:    1. Dyspnea on exertion   2. Essential hypertension   3. Chronic diastolic heart failure (HCC)   4. Mixed hyperlipidemia    PLAN:    In order of problems listed above:  DOE/fatigue Patient denies significant DOE. He feels more tired at times. He is walking 1-2 miles daily. Recent labs were unremarkable. Prior [employer] with no ischemia, but dud showed mild aortic calcifications. Continue Aspirin, BB and statin.  HTN BP is good today. Continue Ziac 5-6.25mg  daily and Verapamil 120mg  daily.   HLD Most recent lipid panel showed TR 337, HDL 24,  LDL 69. He is on Simvastatin, but this causes joint pain. Discussed other options such as Zetia, vascepa, PCSK9i. We also discussed the lipid clinic. He will call back and let know what he would like to pursue.   HFpEF Mild MR He is euvolemic on exam. Echo in 2022 showed normal LVEF, G2DD, with mild MR. No reason to repeat echo at this time. Continue bisoprolol-HCTZ 5-6.25mg  daily.    Disposition: Follow up in 1 year(s) with MD/APP     Signed, Tellis Spivak 2023, PA-C  05/22/2022 8:27 AM    [facility]

## 2022-05-22 ENCOUNTER — Encounter: Payer: Self-pay | Admitting: Medical

## 2022-05-22 ENCOUNTER — Ambulatory Visit: Payer: BC Managed Care – PPO | Admitting: Medical

## 2022-05-22 VITALS — BP 116/80 | HR 57 | Ht 72.0 in | Wt 194.0 lb

## 2022-05-22 DIAGNOSIS — I5032 Chronic diastolic (congestive) heart failure: Secondary | ICD-10-CM

## 2022-05-22 DIAGNOSIS — I10 Essential (primary) hypertension: Secondary | ICD-10-CM

## 2022-05-22 DIAGNOSIS — R0609 Other forms of dyspnea: Secondary | ICD-10-CM | POA: Diagnosis not present

## 2022-05-22 DIAGNOSIS — E782 Mixed hyperlipidemia: Secondary | ICD-10-CM

## 2022-05-22 NOTE — Patient Instructions (Signed)
Medication Instructions:   Your physician recommends that you continue on your current medications as directed. Please refer to the Current Medication list given to you today.  *If you need a refill on your cardiac medications before your next appointment, please call your pharmacy*   Lab Work:  None ordered  Testing/Procedures:  None ordered   Follow-Up: At [facility], you and your health needs are our priority.  As part of our continuing mission to provide you with exceptional heart care, we have created designated Provider Care Teams.  These Care Teams include your primary Cardiologist (physician) and Advanced Practice Providers (APPs -  Physician Assistants and Nurse Practitioners) who all work together to provide you with the care you need, when you need it.  We recommend signing up for the patient portal called "MyChart".  Sign up information is provided on this After Visit Summary.  MyChart is used to connect with patients for Virtual Visits (Telemedicine).  Patients are able to view lab/test results, encounter notes, upcoming appointments, etc.  Non-urgent messages can be sent to your provider as well.   To learn more about what you can do with MyChart, go to [email or web address].    Your next appointment:   1 year(s)  The format for your next appointment:   In Person  Provider:   You may see Dr. Christopher End or one of the following Advanced Practice Providers on your designated Care Team:   Christopher Berge, NP Ryan Dunn, PA-C Cadence Furth, PA-C{   Important Information About Sugar       

## 2023-01-24 ENCOUNTER — Other Ambulatory Visit: Payer: Self-pay | Admitting: Family Medicine

## 2023-01-24 DIAGNOSIS — I10 Essential (primary) hypertension: Secondary | ICD-10-CM

## 2023-02-02 ENCOUNTER — Other Ambulatory Visit (INDEPENDENT_AMBULATORY_CARE_PROVIDER_SITE_OTHER): Payer: BC Managed Care – PPO

## 2023-02-02 DIAGNOSIS — I10 Essential (primary) hypertension: Secondary | ICD-10-CM | POA: Diagnosis not present

## 2023-02-02 LAB — COMPREHENSIVE METABOLIC PANEL
ALT: 17 U/L (ref 0–53)
AST: 15 U/L (ref 0–37)
Albumin: 4.3 g/dL (ref 3.5–5.2)
Alkaline Phosphatase: 70 U/L (ref 39–117)
BUN: 21 mg/dL (ref 6–23)
CO2: 32 mEq/L (ref 19–32)
Calcium: 10 mg/dL (ref 8.4–10.5)
Chloride: 100 mEq/L (ref 96–112)
Creatinine, Ser: 1.47 mg/dL (ref 0.40–1.50)
GFR: 50.26 mL/min — ABNORMAL LOW (ref >60.00)
Glucose, Bld: 89 mg/dL (ref 70–99)
Potassium: 4.2 mEq/L (ref 3.5–5.1)
Sodium: 141 mEq/L (ref 135–145)
Total Bilirubin: 0.7 mg/dL (ref 0.2–1.2)
Total Protein: 6.6 g/dL (ref 6.0–8.3)

## 2023-02-02 LAB — LIPID PANEL
Cholesterol: 156 mg/dL (ref 0–200)
HDL: 28.1 mg/dL — ABNORMAL LOW (ref >39.00)
NonHDL: 127.78
Total CHOL/HDL Ratio: 6
Triglycerides: 258 mg/dL — ABNORMAL HIGH (ref 0.0–149.0)
VLDL: 51.6 mg/dL — ABNORMAL HIGH (ref 0.0–40.0)

## 2023-02-02 LAB — PSA: PSA: 1.18 ng/mL (ref 0.10–4.00)

## 2023-02-02 LAB — LDL CHOLESTEROL, DIRECT: Direct LDL: 80 mg/dL

## 2023-02-09 ENCOUNTER — Encounter: Payer: Self-pay | Admitting: Family Medicine

## 2023-02-09 ENCOUNTER — Ambulatory Visit (INDEPENDENT_AMBULATORY_CARE_PROVIDER_SITE_OTHER): Payer: BC Managed Care – PPO | Admitting: Family Medicine

## 2023-02-09 VITALS — BP 122/68 | HR 76 | Temp 97.8°F | Ht 72.0 in | Wt 197.0 lb

## 2023-02-09 DIAGNOSIS — Z7189 Other specified counseling: Secondary | ICD-10-CM

## 2023-02-09 DIAGNOSIS — Z0000 Encounter for general adult medical examination without abnormal findings: Secondary | ICD-10-CM | POA: Diagnosis not present

## 2023-02-09 DIAGNOSIS — I10 Essential (primary) hypertension: Secondary | ICD-10-CM

## 2023-02-09 DIAGNOSIS — G43109 Migraine with aura, not intractable, without status migrainosus: Secondary | ICD-10-CM

## 2023-02-09 DIAGNOSIS — L821 Other seborrheic keratosis: Secondary | ICD-10-CM

## 2023-02-09 DIAGNOSIS — E7800 Pure hypercholesterolemia, unspecified: Secondary | ICD-10-CM

## 2023-02-09 MED ORDER — PAROXETINE HCL 20 MG PO TABS: 20.0000 mg | ORAL_TABLET | ORAL | 3 refills | Status: DC

## 2023-02-09 MED ORDER — BISOPROLOL-HYDROCHLOROTHIAZIDE 5-6.25 MG PO TABS: 2.0000 | ORAL_TABLET | Freq: Every day | ORAL | 3 refills | Status: DC

## 2023-02-09 MED ORDER — VERAPAMIL HCL ER 120 MG PO TBCR: 240.0000 mg | EXTENDED_RELEASE_TABLET | Freq: Every day | ORAL | 3 refills | Status: DC

## 2023-02-09 NOTE — Patient Instructions (Addendum)
Stop simvastatin for 2 weeks and see if the aches get better.  Either way, let me know.   Update me as needed.  The spots should blister and then heal over.  Take care.  Glad to see you.

## 2023-02-09 NOTE — Progress Notes (Unsigned)
CPE- See plan.  Routine anticipatory guidance given to patient.  See health maintenance.  The possibility exists that previously documented standard health maintenance information may have been brought forward from a previous encounter into this note.  If needed, that same information has been updated to reflect the current situation based on today's encounter.    Tetanus 2016 Flu 2023 PNA not due yet. Shingles prev done.  covid vaccine prev done PSA wnl, d/w pt.   Colonoscopy 2020 Diet and exercise d/w. Living will d/w pt.  Wife would be designated if patient were incapacitated.   HIV and HCV prev neg.   Discussed quitting chewing tobacco, encouraged.  AAA screening d/w pt, consider at age 71.    Elevated Cholesterol: Using medications without problems:see below.   Muscle aches: yes.   Diet compliance: d/w pt.  Exercise: walking for exercise.   Discussed statin stop to see if aches improved.     Migraines improved with paxil.  Sx better on med.  No ADE on med.  Reasonable to continue as is.  Discussed.   Hypertension:               Using medication without problems or lightheadedness: yes Chest pain with exertion:no Edema:no Short of breath: no Labs d/w pt.   D/w pt about baseline Cr, not sig different from prev.  We talked about possible 24 urine collection.  He'll consider.  D/w pt about avoid nsaids.    PMH and SH reviewed  Meds, vitals, and allergies reviewed.   ROS: Per HPI.  Unless specifically indicated otherwise in HPI, the patient denies:  General: fever. Eyes: acute vision changes ENT: sore throat Cardiovascular: chest pain Respiratory: SOB GI: vomiting GU: dysuria Musculoskeletal: acute back pain Derm: acute rash Neuro: acute motor dysfunction Psych: worsening mood Endocrine: polydipsia Heme: bleeding Allergy: hayfever  GEN: nad, alert and oriented HEENT: mucous membranes moist NECK: supple w/o LA CV: rrr. PULM: ctab, no inc wob ABD: soft,  +bs EXT: no edema SKIN: no acute rash, benign SKs on the R upper chest wall. 2 get irritated with his shirt rubbing. Discussed options and he consented for liq N2.  Done at OV x3 at both lesions w/o complication.

## 2023-02-10 DIAGNOSIS — L821 Other seborrheic keratosis: Secondary | ICD-10-CM | POA: Insufficient documentation

## 2023-02-10 NOTE — Assessment & Plan Note (Signed)
Discussed diet and exercise. Stop simvastatin for 2 weeks and see if the aches get better.  Either way, he can let me know.

## 2023-02-10 NOTE — Assessment & Plan Note (Signed)
benign SKs on the R upper chest wall. 2 get irritated with his shirt rubbing. Discussed options and he consented for liq N2.  Done at OV x3 at both lesions w/o complication.

## 2023-02-10 NOTE — Assessment & Plan Note (Signed)
Tetanus 2016 Flu 2023 PNA not due yet. Shingles prev done.  covid vaccine prev done PSA wnl, d/w pt.   Colonoscopy 2020 Diet and exercise d/w. Living will d/w pt.  Wife would be designated if patient were incapacitated.   HIV and HCV prev neg.   Discussed quitting chewing tobacco, encouraged.  AAA screening d/w pt, consider at age 62.

## 2023-02-10 NOTE — Assessment & Plan Note (Signed)
Continue bisoprolol hydrochlorothiazide and verapamil. Labs d/w pt.   D/w pt about baseline Cr, not sig different from prev.  We talked about possible 24 urine collection.  He'll consider.  D/w pt about avoid nsaids.

## 2023-02-10 NOTE — Assessment & Plan Note (Signed)
Living will d/w pt. Wife would be designated if patient were incapacitated.  

## 2023-02-10 NOTE — Assessment & Plan Note (Signed)
Migraines improved with paxil.  Sx better on med.  No ADE on med.  Reasonable to continue as is.  Discussed.

## 2023-05-20 ENCOUNTER — Other Ambulatory Visit: Payer: Self-pay | Admitting: Family Medicine

## 2023-08-28 DIAGNOSIS — M25512 Pain in left shoulder: Secondary | ICD-10-CM | POA: Diagnosis not present

## 2023-08-28 DIAGNOSIS — M25561 Pain in right knee: Secondary | ICD-10-CM | POA: Diagnosis not present

## 2023-08-28 DIAGNOSIS — M25562 Pain in left knee: Secondary | ICD-10-CM | POA: Diagnosis not present

## 2023-08-28 DIAGNOSIS — M25511 Pain in right shoulder: Secondary | ICD-10-CM | POA: Diagnosis not present

## 2023-09-04 DIAGNOSIS — M170 Bilateral primary osteoarthritis of knee: Secondary | ICD-10-CM | POA: Diagnosis not present

## 2023-09-11 DIAGNOSIS — M170 Bilateral primary osteoarthritis of knee: Secondary | ICD-10-CM | POA: Diagnosis not present

## 2023-09-16 NOTE — Progress Notes (Unsigned)
Cardiology Clinic Note   Date: 09/17/2023 ID: DOVBER ERNEST, DOB [birth year], MRN [id number]  Primary Cardiologist:  Yvonne Kendall, MD  Patient Profile    Martin Cole is a 62 y.o. male who presents to the clinic today for routine follow up.     Past medical history significant for: Dyspnea. Nuclear stress test 03/21/2021: Normal, low risk study. Echo 05/04/2021: EF 60 to 65%.  No RWMA.  Grade II DD.  Normal RV function.  Moderate LAE.  Mild MR. Hypertension. Hyperlipidemia. 02/02/2023: LDL 80, HDL 28, TG 258, total 156. Migraines. GERD. Prior tobacco abuse. Joint pain.     History of Present Illness    Martin Cole is followed by Dr. Okey Dupre for the above outlined history.  In summary, patient was initially seen in April 2022 with complaints of shortness of breath at the request of Dr. Para March.  He had recently noticed symptoms while hunting.  He underwent a nuclear stress test which was normal, low risk study.  Echo showed normal LV/RV function with Grade II DD.  Was last seen in the office by Cadence Furth, PA-C on 05/22/2022 for routine follow-up.  He was doing well at that time and no changes were made.  Discussed the use of AI scribe software for clinical note transcription with the patient, who gave verbal consent to proceed.  The patient presents for routine follow-up. He reports palpitations described as "really strong heartbeats" followed by a pause, then a return to the strong beats. This pattern repeats for six to eight beats before resolving. Heart is not racing or skipping and the pounding sensation is regular. The palpitations are associated with a sensation of needing to cough. The episodes have been occurring frequently over the past two weeks and are not related to any specific activity. The patient denies any recent illness or changes in hydration. He also reports receiving injections for knee arthritis, but denies any other changes in health or medications.  Palpitations are similar to years ago.  He is otherwise doing well. Patient denies shortness of breath, dyspnea on exertion, lower extremity edema, orthopnea or PND. No chest pain, pressure, or tightness.  He stays active by walking daily. he normally walks a couple of miles but has recently been limited secondary to knee pain.         ROS: All other systems reviewed and are otherwise negative except as noted in History of Present Illness.  Studies Reviewed    EKG Interpretation Date/Time:  Monday September 17 2023 10:30:43 EDT Ventricular Rate:  49 PR Interval:  188 QRS Duration:  150 QT Interval:  448 QTC Calculation: 404 R Axis:   56  Text Interpretation: Sinus bradycardia Right bundle branch block Nonspecific T wave changes When compared with ECG of 05/22/2022 (not in Muse) no significant changes Confirmed by Carlos Levering [phone]) on 09/17/2023 10:46:11 AM           Physical Exam    VS:  BP (!) 96/56   Pulse (!) 49   Ht 6' (1.829 m)   Wt 195 lb (88.5 kg)   SpO2 98%   BMI 26.45 kg/m  , BMI Body mass index is 26.45 kg/m.  GEN: Well nourished, well developed, in no acute distress. Neck: No JVD or carotid bruits. Cardiac:  RRR. No murmurs. No rubs or gallops.   Respiratory:  Respirations regular and unlabored. Clear to auscultation without rales, wheezing or rhonchi. GI: Soft, nontender, nondistended. Extremities: Radials/DP/PT 2+ and equal bilaterally.  No clubbing or cyanosis. No edema.  Skin: Warm and dry, no rash. Neuro: Strength intact.  Assessment & Plan      Palpitations New onset of palpitations over the past 2 weeks, described as a strong heartbeat followed by a pause. No associated shortness of breath, chest pain, or dizziness. EKG shows known right bundle branch block, unchanged. Discussed the possibility of premature beats (PVCs or PACs) and the option of a 2-week heart monitor for further evaluation. He would like to hold off. He agrees to send a message  if he changes his mind.  - Monitor symptoms and consider heart monitor if symptoms persist or worsen. - Increase hydration.  Hypertension Blood pressure slightly low at 96/56 on intake and 102/58 on my recheck. He does not check BP at home. He denies lightheadedness or dizziness.  -Continue Ziac and Verapamil.   -Monitor blood pressure at home a couple of times a week. - Consider reducing Verapamil dose if blood pressure consistently low.      Disposition: Return in 1 year or sooner as needed.          Signed, Etta Grandchild. Rhylen Pulido, DNP, NP-C

## 2023-09-17 ENCOUNTER — Ambulatory Visit: Payer: BC Managed Care – PPO | Attending: Student | Admitting: Student

## 2023-09-17 ENCOUNTER — Encounter: Payer: Self-pay | Admitting: Student

## 2023-09-17 VITALS — BP 96/56 | HR 49 | Ht 72.0 in | Wt 195.0 lb

## 2023-09-17 DIAGNOSIS — I10 Essential (primary) hypertension: Secondary | ICD-10-CM

## 2023-09-17 DIAGNOSIS — R002 Palpitations: Secondary | ICD-10-CM | POA: Diagnosis not present

## 2023-09-17 NOTE — Patient Instructions (Signed)
Medication Instructions:  Your Physician recommend you continue on your current medication as directed.    *If you need a refill on your cardiac medications before your next appointment, please call your pharmacy*   Follow-Up: At [facility], you and your health needs are our priority.  As part of our continuing mission to provide you with exceptional heart care, we have created designated Provider Care Teams.  These Care Teams include your primary Cardiologist (physician) and Advanced Practice Providers (APPs -  Physician Assistants and Nurse Practitioners) who all work together to provide you with the care you need, when you need it.  We recommend signing up for the patient portal called "MyChart".  Sign up information is provided on this After Visit Summary.  MyChart is used to connect with patients for Virtual Visits (Telemedicine).  Patients are able to view lab/test results, encounter notes, upcoming appointments, etc.  Non-urgent messages can be sent to your provider as well.   To learn more about what you can do with MyChart, go to [email or web address].    Your next appointment:   1 year(s)  Provider:   You may see Yvonne Kendall, MD or one of the following Advanced Practice Providers on your designated Care Team:   Nicolasa Ducking, NP Eula Listen, PA-C Cadence Fransico Michael, PA-C Charlsie Quest, NP

## 2023-09-18 DIAGNOSIS — M170 Bilateral primary osteoarthritis of knee: Secondary | ICD-10-CM | POA: Diagnosis not present

## 2023-11-13 ENCOUNTER — Ambulatory Visit (INDEPENDENT_AMBULATORY_CARE_PROVIDER_SITE_OTHER): Payer: BC Managed Care – PPO

## 2023-11-13 DIAGNOSIS — Z23 Encounter for immunization: Secondary | ICD-10-CM

## 2024-02-03 ENCOUNTER — Other Ambulatory Visit: Payer: Self-pay | Admitting: Family Medicine

## 2024-02-03 DIAGNOSIS — I10 Essential (primary) hypertension: Secondary | ICD-10-CM

## 2024-02-03 DIAGNOSIS — Z125 Encounter for screening for malignant neoplasm of prostate: Secondary | ICD-10-CM

## 2024-02-05 ENCOUNTER — Other Ambulatory Visit (INDEPENDENT_AMBULATORY_CARE_PROVIDER_SITE_OTHER): Payer: BC Managed Care – PPO

## 2024-02-05 DIAGNOSIS — Z125 Encounter for screening for malignant neoplasm of prostate: Secondary | ICD-10-CM | POA: Diagnosis not present

## 2024-02-05 DIAGNOSIS — I10 Essential (primary) hypertension: Secondary | ICD-10-CM | POA: Diagnosis not present

## 2024-02-05 LAB — COMPREHENSIVE METABOLIC PANEL
ALT: 16 U/L (ref 0–53)
AST: 14 U/L (ref 0–37)
Albumin: 4.4 g/dL (ref 3.5–5.2)
Alkaline Phosphatase: 75 U/L (ref 39–117)
BUN: 21 mg/dL (ref 6–23)
CO2: 33 meq/L — ABNORMAL HIGH (ref 19–32)
Calcium: 9.9 mg/dL (ref 8.4–10.5)
Chloride: 103 meq/L (ref 96–112)
Creatinine, Ser: 1.39 mg/dL (ref 0.40–1.50)
GFR: 53.37 mL/min — ABNORMAL LOW (ref >60.00)
Glucose, Bld: 100 mg/dL — ABNORMAL HIGH (ref 70–99)
Potassium: 4.7 meq/L (ref 3.5–5.1)
Sodium: 141 meq/L (ref 135–145)
Total Bilirubin: 0.6 mg/dL (ref 0.2–1.2)
Total Protein: 6.6 g/dL (ref 6.0–8.3)

## 2024-02-05 LAB — LIPID PANEL
Cholesterol: 143 mg/dL (ref 0–200)
HDL: 27.6 mg/dL — ABNORMAL LOW (ref >39.00)
LDL Cholesterol: 72 mg/dL (ref 0–99)
NonHDL: 115.54
Total CHOL/HDL Ratio: 5
Triglycerides: 216 mg/dL — ABNORMAL HIGH (ref 0.0–149.0)
VLDL: 43.2 mg/dL — ABNORMAL HIGH (ref 0.0–40.0)

## 2024-02-05 LAB — PSA: PSA: 1 ng/mL (ref 0.10–4.00)

## 2024-02-12 ENCOUNTER — Encounter: Payer: Self-pay | Admitting: Family Medicine

## 2024-02-12 ENCOUNTER — Ambulatory Visit: Payer: BC Managed Care – PPO | Admitting: Family Medicine

## 2024-02-12 VITALS — BP 124/64 | HR 61 | Temp 98.2°F | Ht 70.28 in | Wt 199.6 lb

## 2024-02-12 DIAGNOSIS — Z136 Encounter for screening for cardiovascular disorders: Secondary | ICD-10-CM | POA: Diagnosis not present

## 2024-02-12 DIAGNOSIS — Z0000 Encounter for general adult medical examination without abnormal findings: Secondary | ICD-10-CM | POA: Diagnosis not present

## 2024-02-12 DIAGNOSIS — I10 Essential (primary) hypertension: Secondary | ICD-10-CM | POA: Diagnosis not present

## 2024-02-12 DIAGNOSIS — E7800 Pure hypercholesterolemia, unspecified: Secondary | ICD-10-CM | POA: Diagnosis not present

## 2024-02-12 DIAGNOSIS — Z23 Encounter for immunization: Secondary | ICD-10-CM

## 2024-02-12 DIAGNOSIS — G43109 Migraine with aura, not intractable, without status migrainosus: Secondary | ICD-10-CM

## 2024-02-12 DIAGNOSIS — Z7189 Other specified counseling: Secondary | ICD-10-CM

## 2024-02-12 MED ORDER — PAROXETINE HCL 20 MG PO TABS: 20.0000 mg | ORAL_TABLET | ORAL | 3 refills | Status: AC

## 2024-02-12 MED ORDER — BISOPROLOL-HYDROCHLOROTHIAZIDE 5-6.25 MG PO TABS: 2.0000 | ORAL_TABLET | Freq: Every day | ORAL | 3 refills | Status: AC

## 2024-02-12 MED ORDER — VERAPAMIL HCL ER 120 MG PO TBCR: EXTENDED_RELEASE_TABLET | ORAL | Status: DC

## 2024-02-12 MED ORDER — SIMVASTATIN 40 MG PO TABS: 40.0000 mg | ORAL_TABLET | Freq: Every day | ORAL | 3 refills | Status: AC

## 2024-02-12 NOTE — Patient Instructions (Signed)
 AAA ultrasound ordered.  Take care.  Glad to see you. Update me as needed.  I'll check on verapamil options.

## 2024-02-12 NOTE — Progress Notes (Unsigned)
 I have personally reviewed the Medicare Annual Wellness questionnaire and have noted 1. The patient's medical and social history 2. Their use of alcohol, tobacco or illicit drugs 3. Their current medications and supplements 4. The patient's functional ability including ADL's, fall risks, home safety risks and hearing or visual             impairment. 5. Diet and physical activities 6. Evidence for depression or mood disorders  The patients weight, height, BMI have been recorded in the chart and visual acuity is per eye clinic.  I have made referrals, counseling and provided education to the patient based review of the above and I have provided the pt with a written personalized care plan for preventive services.  Provider list updated- see scanned forms.  Routine anticipatory guidance given to patient.  See health maintenance. The possibility exists that previously documented standard health maintenance information may have been brought forward from a previous encounter into this note.  If needed, that same information has been updated to reflect the current situation based on today's encounter.    Tetanus 2016 Flu 2024 PNA 2025 Shingles prev done.  covid vaccine prev done PSA wnl, d/w pt.   Colonoscopy 2020 Diet and exercise d/w. Living will d/w pt.  Wife would be designated if patient were incapacitated.   HIV and HCV prev neg.   Discussed quitting chewing tobacco, encouraged.  AAA screening ordered, d/w pt.   Cognitive function addressed- see scanned forms- and if abnormal then additional documentation follows.   In addition to [facility] Wellness, follow up visit for the below conditions:  Cr d/w pt, similar to recent levels and he avoids nsaids.    Hypertension:    Using medication without problems or lightheadedness: yes Chest pain with exertion:no Edema:no Short of breath:no HR controlled with current verapamil dose but cost is high.   Elevated Cholesterol: Using  medications without problems:yes Muscle aches: not from medicine- no change with cessation or restart Diet compliance: d/w pt.  Exercise: d/w pt.    Still on paxil.  Migraines better on paxil. Mood is good.  Retired.  No SI/HI.    PMH and SH reviewed  Meds, vitals, and allergies reviewed.   ROS: Per HPI.  Unless specifically indicated otherwise in HPI, the patient denies:  General: fever. Eyes: acute vision changes ENT: sore throat Cardiovascular: chest pain Respiratory: SOB GI: vomiting GU: dysuria Musculoskeletal: acute back pain Derm: acute rash Neuro: acute motor dysfunction Psych: worsening mood Endocrine: polydipsia Heme: bleeding Allergy: hayfever  GEN: nad, alert and oriented HEENT: mucous membranes moist NECK: supple w/o LA CV: rrr. PULM: ctab, no inc wob ABD: soft, +bs EXT: no edema SKIN: no acute rash

## 2024-02-13 NOTE — Assessment & Plan Note (Signed)
 Continue work on diet and exercise.  He has tolerated simvastatin without muscle aches.

## 2024-02-13 NOTE — Assessment & Plan Note (Signed)
  Living will d/w pt.  Wife would be designated if patient were incapacitated.   

## 2024-02-13 NOTE — Assessment & Plan Note (Addendum)
 Labs discussed with patient.  I will ask for pharmacy help regarding verapamil cost.  Continue bisoprolol hydrochlorothiazide.

## 2024-02-13 NOTE — Assessment & Plan Note (Signed)
 Tetanus 2016 Flu 2024 PNA 2025 Shingles prev done.  covid vaccine prev done PSA wnl, d/w pt.   Colonoscopy 2020 Diet and exercise d/w. Living will d/w pt.  Wife would be designated if patient were incapacitated.   HIV and HCV prev neg.   Discussed quitting chewing tobacco, encouraged.  AAA screening ordered 2025, d/w pt.   Cognitive function addressed- see scanned forms- and if abnormal then additional documentation follows.

## 2024-02-13 NOTE — Assessment & Plan Note (Signed)
 Continue Paxil.  Migraines improved on Paxil.

## 2024-02-14 ENCOUNTER — Telehealth: Payer: Self-pay | Admitting: *Deleted

## 2024-02-14 ENCOUNTER — Ambulatory Visit
Admission: RE | Admit: 2024-02-14 | Discharge: 2024-02-14 | Discharge status: Home / Self Care | Source: Ambulatory Visit | Attending: Family Medicine | Admitting: Family Medicine

## 2024-02-14 DIAGNOSIS — Z87891 Personal history of nicotine dependence: Secondary | ICD-10-CM | POA: Diagnosis not present

## 2024-02-14 DIAGNOSIS — Z136 Encounter for screening for cardiovascular disorders: Secondary | ICD-10-CM | POA: Diagnosis not present

## 2024-02-14 DIAGNOSIS — I10 Essential (primary) hypertension: Secondary | ICD-10-CM | POA: Diagnosis not present

## 2024-02-14 NOTE — Progress Notes (Signed)
 Care Guide Pharmacy Note  02/14/2024 Name: Martin Cole MRN: [id number] DOB: 1961-03-26  Referred By: Joaquim Nam, MD Reason for referral: Care Coordination (Initial outreach to schedule referral with PharmD )   Martin Cole is a 63 y.o. year old male who is a primary care patient of Joaquim Nam, MD.  [name] was referred to the pharmacist for assistance related to: HTN medication assistance for Verapamil   Successful contact was made with the patient to discuss pharmacy services including being ready for the pharmacist to call at least 5 minutes before the scheduled appointment time and to have medication bottles and any blood pressure readings ready for review. The patient agreed to meet with the pharmacist via telephone visit on (date/time).3/19 at 8:00 am  Gwenevere Ghazi  [facility] Guide  Direct Dial: [phone]  Fax [phone]

## 2024-02-17 ENCOUNTER — Encounter: Payer: Self-pay | Admitting: Family Medicine

## 2024-02-20 ENCOUNTER — Other Ambulatory Visit (INDEPENDENT_AMBULATORY_CARE_PROVIDER_SITE_OTHER): Admitting: Pharmacist

## 2024-02-20 DIAGNOSIS — I10 Essential (primary) hypertension: Secondary | ICD-10-CM

## 2024-02-20 MED ORDER — VERAPAMIL HCL ER 120 MG PO TBCR: EXTENDED_RELEASE_TABLET | ORAL | 3 refills | Status: AC

## 2024-02-20 NOTE — Progress Notes (Signed)
 Rx signed. Thanks!

## 2024-02-20 NOTE — Progress Notes (Signed)
   02/20/2024 Name: Martin Cole MRN: [id number] DOB: Nov 21, 1961  Subjective  Chief Complaint  Patient presents with   Medication Access   Hypertension   Care Team: Primary Care Provider: Joaquim Nam, MD  Reason for visit: ?  Martin Cole is a 63 y.o. male who presents today for a telephone visit with the pharmacist due to medication access concerns regarding their verapamil. ?   Medication Access: ?  Prescription drug coverage: Payor: [employer] / Plan: [employer] HMO / Product Type: *No Product type* / .   Current Patient Assistance:  None  Medicare LIS: Does not qualify (household of 2, income exceeded)    Assessment and Plan:   1. Medication Access Reviewed with patient HTA summary of drug benefits and Rx formulary  Verapamil ER capsules are Tier 3, though the ER tablets are listed as a Tier 2 medication( $5/month) which patient feels is affordable. Called [facility] to confirm copay.  No cost concern with other medications (confirmed all are Tier 1-2, $0 to $5 per month).  Does not qualify for LIS (Medicare "Extra Help".  Reviewed Medicare patient eligibility for patient assistance programs should he ever be prescribed brand-name medication.   No future appointments.   Loree Fee, PharmD Clinical Pharmacist [facility] [phone]

## 2024-11-18 ENCOUNTER — Ambulatory Visit: Attending: Medical | Admitting: Medical

## 2024-11-18 ENCOUNTER — Encounter: Payer: Self-pay | Admitting: Medical

## 2024-11-18 VITALS — BP 110/64 | HR 53 | Ht 72.0 in | Wt 200.0 lb

## 2024-11-18 DIAGNOSIS — I10 Essential (primary) hypertension: Secondary | ICD-10-CM

## 2024-11-18 DIAGNOSIS — R002 Palpitations: Secondary | ICD-10-CM

## 2024-11-18 DIAGNOSIS — E782 Mixed hyperlipidemia: Secondary | ICD-10-CM

## 2024-11-18 NOTE — Patient Instructions (Signed)
 Medication Instructions:  Your physician recommends that you continue on your current medications as directed. Please refer to the Current Medication list given to you today.   *If you need a refill on your cardiac medications before your next appointment, please call your pharmacy*  Lab Work: No labs ordered today  If you have labs (blood work) drawn today and your tests are completely normal, you will receive your results only by: MyChart Message (if you have MyChart) OR A paper copy in the mail If you have any lab test that is abnormal or we need to change your treatment, we will call you to review the results.  Testing/Procedures: No test ordered today   Follow-Up: At [facility], you and your health needs are our priority.  As part of our continuing mission to provide you with exceptional heart care, our providers are all part of one team.  This team includes your primary Cardiologist (physician) and Advanced Practice Providers or APPs (Physician Assistants and Nurse Practitioners) who all work together to provide you with the care you need, when you need it.  Your next appointment:   12 month(s)  Provider:   You may see Lonni Hanson, MD or one of the following Advanced Practice Providers on your designated Care Team:   Lonni Meager, NP Lesley Maffucci, PA-C Bernardino Bring, PA-C Cadence Long Hill, PA-C Tylene Lunch, NP Barnie Hila, NP    We recommend signing up for the patient portal called MyChart.  Sign up information is provided on this After Visit Summary.  MyChart is used to connect with patients for Virtual Visits (Telemedicine).  Patients are able to view lab/test results, encounter notes, upcoming appointments, etc.  Non-urgent messages can be sent to your provider as well.   To learn more about what you can do with MyChart, go to [email or web address].

## 2024-11-18 NOTE — Progress Notes (Signed)
°  Cardiology Office Note   Date:  11/18/2024  ID:  Martin Cole, DOB January 14, 1961, MRN [id number] PCP: Cleatus Arlyss RAMAN, MD  [facility] Providers Cardiologist:  Lonni Hanson, MD  History of Present Illness Martin Cole is a 63 y.o. male with a hx of HTN, HLD, migraine headaches, GERD, anxiety, prior tobacco use (30 years), COVID in 2020. He reported normal stress test 15-20 years ago.   He was seen 03/16/21 and reported shortness of breath with exertion. Echo and stress test were ordered. Echo showed LVEF 60-65%, G2DD, normal RV function, moderately dilated LA, mild MR. Stress test was normal, overall low risk, LVEF 55-65%, CT attenuation showed mild aortic calcifications but no significant coronary calcifications.    Patient was last seen 09/17/2023 for routine follow-up.  Patient reported palpitations and a 2-week heart monitor was discussed, the patient elected to defer.  Today, the patient reports he is overall doing well from a cardiac perspective.  He denies further palpitations. He denies chest pain, SOB, lower leg leg edema, lightheadedness, dizziness. He walks 3-5 miles weekly. Diet could be better.   Studies Reviewed EKG Interpretation Date/Time:  Tuesday November 18 2024 08:42:45 EST Ventricular Rate:  53 PR Interval:  186 QRS Duration:  136 QT Interval:  446 QTC Calculation: 418 R Axis:   19  Text Interpretation: Sinus bradycardia Right bundle branch block When compared with ECG of 17-Sep-2023 10:30, No significant change was found Confirmed by Franchester, Brittain Hosie ([id number]) on 11/18/2024 8:52:49 AM        Physical Exam VS:  BP 110/64 (BP Location: Left Arm, Patient Position: Sitting, Cuff Size: Normal)   Pulse (!) 53   Ht 6' (1.829 m)   Wt 200 lb (90.7 kg)   SpO2 97%   BMI 27.12 kg/m        Wt Readings from Last 3 Encounters:  11/18/24 200 lb (90.7 kg)  02/12/24 199 lb 9.6 oz (90.5 kg)  09/17/23 195 lb (88.5 kg)    GEN: Well nourished, well developed  in no acute distress NECK: No JVD; No carotid bruits CARDIAC: bradycardia, RR, no murmurs, rubs, gallops RESPIRATORY:  Clear to auscultation without rales, wheezing or rhonchi  ABDOMEN: Soft, non-tender, non-distended EXTREMITIES:  No edema; No deformity   ASSESSMENT AND PLAN  HTN BP today is good. Continue bisoprolol /hydrochlorothiazide  5/6.25 mg 2 tablets daily and Verapamil  120 mg alternating 240 mg.   Palpitations He denies palpitations. EKG shows SB, RBBB. HR 53bpm.  HLD LDL 72, TG 216, HDL 27, TC 143. Discussed diet changes. Continue simvastatin  40mg  daily.       Dispo: Follow-up in 1 year  Signed, Angelis Gates VEAR Franchester, PA-C

## 2025-01-02 ENCOUNTER — Telehealth: Payer: Self-pay

## 2025-01-02 NOTE — Telephone Encounter (Signed)
 Copied from CRM #[id number]. Topic: Clinical - Request for Lab/Test Order >> Jan 02, 2025 12:33 PM Harlene ORN wrote: Reason for CRM: scheduled for his physical for 03/16 @ 10:30. Will need labs.

## 2025-01-07 NOTE — Telephone Encounter (Signed)
 Called and schedule pt for  labs

## 2025-02-09 ENCOUNTER — Other Ambulatory Visit

## 2025-02-16 ENCOUNTER — Encounter: Admitting: Family Medicine
